# Patient Record
Sex: Female | Born: 1970 | Hispanic: No | Marital: Married | Smoking: Never smoker
Health system: Southern US, Community
[De-identification: ages and names within clinical notes are randomized; demographics above are authoritative.]

## PROBLEM LIST (undated history)

## (undated) DIAGNOSIS — I10 Essential (primary) hypertension: Secondary | ICD-10-CM

## (undated) DIAGNOSIS — F419 Anxiety disorder, unspecified: Secondary | ICD-10-CM

## (undated) HISTORY — PX: BREAST EXCISIONAL BIOPSY: SUR124

## (undated) HISTORY — DX: Essential (primary) hypertension: I10

---

## 2010-05-10 ENCOUNTER — Other Ambulatory Visit: Payer: Self-pay | Admitting: Obstetrics and Gynecology

## 2010-05-10 DIAGNOSIS — N644 Mastodynia: Secondary | ICD-10-CM

## 2010-05-16 ENCOUNTER — Ambulatory Visit
Admission: RE | Admit: 2010-05-16 | Discharge: 2010-05-16 | Disposition: A | Discharge status: Home / Self Care | Payer: BC Managed Care – PPO | Source: Ambulatory Visit | Attending: Obstetrics and Gynecology | Admitting: Obstetrics and Gynecology

## 2010-05-16 DIAGNOSIS — N644 Mastodynia: Secondary | ICD-10-CM

## 2010-06-27 ENCOUNTER — Other Ambulatory Visit (HOSPITAL_COMMUNITY): Payer: BC Managed Care – PPO

## 2010-07-02 ENCOUNTER — Ambulatory Visit (HOSPITAL_COMMUNITY)
Admission: RE | Admit: 2010-07-02 | Payer: No Typology Code available for payment source | Source: Ambulatory Visit | Admitting: Obstetrics and Gynecology

## 2011-06-23 ENCOUNTER — Other Ambulatory Visit (HOSPITAL_COMMUNITY): Payer: Self-pay | Admitting: Obstetrics and Gynecology

## 2011-06-23 DIAGNOSIS — Z1231 Encounter for screening mammogram for malignant neoplasm of breast: Secondary | ICD-10-CM

## 2011-07-17 ENCOUNTER — Ambulatory Visit (HOSPITAL_COMMUNITY)
Admission: RE | Admit: 2011-07-17 | Discharge: 2011-07-17 | Disposition: A | Discharge status: Home / Self Care | Payer: No Typology Code available for payment source | Source: Ambulatory Visit | Attending: Obstetrics and Gynecology | Admitting: Obstetrics and Gynecology

## 2011-07-17 DIAGNOSIS — Z1231 Encounter for screening mammogram for malignant neoplasm of breast: Secondary | ICD-10-CM

## 2011-07-21 ENCOUNTER — Other Ambulatory Visit: Payer: Self-pay | Admitting: Obstetrics and Gynecology

## 2011-07-21 DIAGNOSIS — R928 Other abnormal and inconclusive findings on diagnostic imaging of breast: Secondary | ICD-10-CM

## 2011-08-19 ENCOUNTER — Encounter (HOSPITAL_COMMUNITY): Payer: Self-pay

## 2011-08-19 ENCOUNTER — Ambulatory Visit (INDEPENDENT_AMBULATORY_CARE_PROVIDER_SITE_OTHER): Payer: Self-pay | Admitting: *Deleted

## 2011-08-19 VITALS — BP 117/86 | HR 76 | Temp 98.6°F | Ht 59.0 in | Wt 184.0 lb

## 2011-08-19 DIAGNOSIS — N6311 Unspecified lump in the right breast, upper outer quadrant: Secondary | ICD-10-CM

## 2011-08-19 DIAGNOSIS — N63 Unspecified lump in unspecified breast: Secondary | ICD-10-CM

## 2011-08-19 DIAGNOSIS — Z01419 Encounter for gynecological examination (general) (routine) without abnormal findings: Secondary | ICD-10-CM

## 2011-08-19 NOTE — Progress Notes (Signed)
Patient referred to [facility] from the [facility] for additional imaging of right breast. Screening mammogram completed 07/18/11.  Pap Smear:    Pap smear completed today. Per patient last Pap smear was around April 2011 and normal. Per patient had an abnormal Pap smear in 2009 or 2010 the required a repeat Pap smear. No Pap smear results in EPIC.  Physical exam: Breasts Breasts symmetrical. No skin abnormalities bilateral breasts. No nipple retraction bilateral breasts. No nipple discharge bilateral breasts. No lymphadenopathy. No lumps palpated left breast. Palpated small lump in right breast at 11 o'clock 2 cm from the areola per patient has been there since 2011. No complaints of pain or tenderness on exam. Patient referred to the [facility] for right breast diagnostic mammogram. Appointment scheduled Monday, August 25, 2011 at 0830.        Pelvic/Bimanual   Ext Genitalia No lesions, no swelling and no discharge observed on external genitalia. Patient has a piercing on clitoris.         Vagina Vagina pink and normal texture. No lesions or discharge observed in vagina.          Cervix Cervix is present. Cervix pink and of normal texture. No discharge observed.     Uterus Uterus is present and palpable. Uterus in normal position and normal size.        Adnexae Bilateral ovaries present and palpable. No tenderness on palpation.          Rectovaginal No rectal exam completed today since patient had no rectal complaints. No skin abnormalities observed on exam.

## 2011-08-19 NOTE — Patient Instructions (Signed)
Taught patient how to perform BSE. Let patient know [facility] will cover Pap smears every 3 years unless has a history of abnormal Pap smears and follow up Pap smear will be based on result of today's Pap smear. Patient referred to the [facility] for right breast diagnostic mammogram. Appointment scheduled Monday, August 25, 2011 at 0830. Patient aware of appointment and will be there. Let patient know will follow up with her within the next couple weeks with results. Patient verbalized understanding.

## 2011-08-25 ENCOUNTER — Other Ambulatory Visit: Payer: Self-pay

## 2011-08-29 ENCOUNTER — Other Ambulatory Visit: Payer: Self-pay

## 2011-09-08 ENCOUNTER — Ambulatory Visit
Admission: RE | Admit: 2011-09-08 | Discharge: 2011-09-08 | Disposition: A | Discharge status: Home / Self Care | Payer: No Typology Code available for payment source | Source: Ambulatory Visit | Attending: Obstetrics and Gynecology | Admitting: Obstetrics and Gynecology

## 2011-09-08 DIAGNOSIS — R928 Other abnormal and inconclusive findings on diagnostic imaging of breast: Secondary | ICD-10-CM

## 2011-09-11 ENCOUNTER — Encounter: Payer: Self-pay | Admitting: *Deleted

## 2011-10-20 ENCOUNTER — Telehealth: Payer: Self-pay | Admitting: *Deleted

## 2011-10-20 NOTE — Telephone Encounter (Signed)
Patient called me in regards to having a strong vaginal odor and burning when urinates. Suggested to patient she try the [facility]. Asked patient if has a PCP and suggested that as an option. Told patient can refer her to the wom[facility] clinics but may be a couple of weeks before she can be seen and if has an infection does not want to wait two weeks. Patient stated she is going to try calling her local Health [facility] first. Told patient if has any questions or has any trouble scheduling an appointment to call me back. Patient verbalized understanding.

## 2011-10-23 ENCOUNTER — Telehealth: Payer: Self-pay | Admitting: *Deleted

## 2011-10-23 NOTE — Telephone Encounter (Signed)
Patient called me yesterday and left voicemail in regards to having some breast concerns. Patient is a [facility] patient and states she has some breast concerns. Called patient back and she stated the area of concern in her right breast has increased in size since seen at [facility] 08/19/11 and is painful to the touch. Let patient know she will need to be seen in [facility] clinic before we can order a diagnostic mammogram. Let patient know either Sabrina or I will call her back with appointment. Patient verbalized understanding.

## 2011-10-24 ENCOUNTER — Telehealth: Payer: Self-pay | Admitting: *Deleted

## 2011-10-24 NOTE — Telephone Encounter (Signed)
Telephoned patient at home # and advised of appointment scheduled for Tuesday August 6 1:00pm Patient voiced understanding.

## 2011-10-28 ENCOUNTER — Encounter (HOSPITAL_COMMUNITY): Payer: Self-pay

## 2011-10-28 ENCOUNTER — Ambulatory Visit (HOSPITAL_COMMUNITY)
Admission: RE | Admit: 2011-10-28 | Discharge: 2011-10-28 | Disposition: A | Discharge status: Home / Self Care | Payer: Self-pay | Source: Ambulatory Visit | Attending: Obstetrics and Gynecology | Admitting: Obstetrics and Gynecology

## 2011-10-28 ENCOUNTER — Other Ambulatory Visit: Payer: Self-pay | Admitting: Obstetrics and Gynecology

## 2011-10-28 DIAGNOSIS — N644 Mastodynia: Secondary | ICD-10-CM

## 2011-10-28 DIAGNOSIS — N6310 Unspecified lump in the right breast, unspecified quadrant: Secondary | ICD-10-CM

## 2011-10-28 DIAGNOSIS — Z1239 Encounter for other screening for malignant neoplasm of breast: Secondary | ICD-10-CM

## 2011-11-27 ENCOUNTER — Telehealth: Payer: Self-pay | Admitting: *Deleted

## 2011-11-27 NOTE — Telephone Encounter (Signed)
Telephoned patient at home number. Patient will call and reschedule diagnostic mammogram.

## 2012-01-12 NOTE — Patient Instructions (Signed)
See detailed notes in EPIC scanned under media.

## 2012-01-12 NOTE — Progress Notes (Signed)
See detailed notes in EPIC scanned under media. 

## 2012-03-03 NOTE — Addendum Note (Signed)
Encounter addended by: Saintclair Halsted, RN on: 03/03/2012  2:19 PM<BR>     Documentation filed: Charges VN

## 2012-05-27 ENCOUNTER — Telehealth (HOSPITAL_COMMUNITY): Payer: Self-pay | Admitting: *Deleted

## 2012-05-27 NOTE — Telephone Encounter (Signed)
Telephoned patient at home # and patient states went to see a GYN in [facility] and that GYN Dr. Charline Bills "may be just lumpy breast". Advised patient to reschedule right diagnostic mammogram at breast center. Patient still covered under B[facility]CP. Patient voiced understanding.

## 2012-07-12 ENCOUNTER — Telehealth (HOSPITAL_COMMUNITY): Payer: Self-pay | Admitting: *Deleted

## 2012-07-12 ENCOUNTER — Encounter (HOSPITAL_COMMUNITY): Payer: Self-pay | Admitting: *Deleted

## 2012-07-12 NOTE — Telephone Encounter (Signed)
Called patient to remind her to reschedule her right breast diagnostic mammogram at the [facility] [place]. Gave patient phone number to call the Breast Center. Reminded patient that her [facility] card expires 08/18/2012. Let her know that if she doesn't reschedule prior to 08/18/12 that she will need to come to [facility] again prior to her appointment. Patient verbalized understanding.

## 2012-09-17 ENCOUNTER — Telehealth (HOSPITAL_COMMUNITY): Payer: Self-pay | Admitting: *Deleted

## 2012-09-17 NOTE — Telephone Encounter (Signed)
Telephoned patient at home # and left message to return call to [facility] 

## 2012-12-06 ENCOUNTER — Other Ambulatory Visit: Payer: Self-pay | Admitting: Obstetrics and Gynecology

## 2012-12-06 DIAGNOSIS — Z1231 Encounter for screening mammogram for malignant neoplasm of breast: Secondary | ICD-10-CM

## 2012-12-07 ENCOUNTER — Inpatient Hospital Stay (HOSPITAL_COMMUNITY): Admission: RE | Admit: 2012-12-07 | Payer: No Typology Code available for payment source | Source: Ambulatory Visit

## 2012-12-07 ENCOUNTER — Ambulatory Visit (HOSPITAL_COMMUNITY): Payer: No Typology Code available for payment source | Attending: Obstetrics and Gynecology

## 2013-09-29 ENCOUNTER — Emergency Department (INDEPENDENT_AMBULATORY_CARE_PROVIDER_SITE_OTHER)
Admission: EM | Admit: 2013-09-29 | Discharge: 2013-09-29 | Disposition: A | Discharge status: Home / Self Care | Payer: Self-pay | Source: Home / Self Care | Attending: Emergency Medicine | Admitting: Emergency Medicine

## 2013-09-29 ENCOUNTER — Encounter (HOSPITAL_COMMUNITY): Payer: Self-pay | Admitting: Emergency Medicine

## 2013-09-29 DIAGNOSIS — T192XXA Foreign body in vulva and vagina, initial encounter: Secondary | ICD-10-CM

## 2013-09-29 DIAGNOSIS — X58XXXA Exposure to other specified factors, initial encounter: Secondary | ICD-10-CM

## 2013-09-29 NOTE — Discharge Instructions (Signed)
Use Vagisil cream as soothing ointment.

## 2013-09-29 NOTE — ED Provider Notes (Signed)
  Chief Complaint   Chief Complaint  Patient presents with  . Foreign Body in Vagina    History of Present Illness   Taylor Casey is a 43 year old female who's husband inserted a lemon in her vagina a couple of hours ago. She's been unable to remove it. She denies any pain or discharge. There's been no bleeding.  Review of Systems   Other than as noted above, the patient denies any of the following symptoms: Systemic:  No fever or chills GI:  No abdominal pain, nausea, vomiting, diarrhea, constipation, melena or hematochezia. GU:  No dysuria, frequency, urgency, hematuria, vaginal discharge, itching, or abnormal vaginal bleeding.  [place]   Past medical history, family history, social history, meds, and allergies were reviewed.    Physical Examination    Vital signs:  BP 137/90  Pulse 90  Temp(Src) 98.9 F (37.2 C) (Oral)  Resp 16  SpO2 100% General:  Alert, oriented and in no distress. Lungs:  Breath sounds clear and equal bilaterally.  No wheezes, rales or rhonchi. Heart:  Regular rhythm.  No gallops or murmers. Abdomen:  Soft, flat and non-distended.  No organomegaly or mass.  No tenderness, guarding or rebound.  Bowel sounds normally active. Pelvic exam:  External genitalia unremarkable. There is a lemon in the vagina. This was removed with ring forceps. There was a little bit of irritation of the vaginal wall but no discharge or bleeding. Cervix appeared normal. Uterus was midposition, normal in size and shape, and there were no adnexal masses or tenderness. Skin:  Clear, warm and dry.  Chaperoned by Ninfa Linden, RN who was present throughout the pelvic exam.   Procedure Note:  Verbal informed consent was obtained from the patient.  Risks and benefits were outlined with the patient.  Patient understands and accepts these risks. A time out was called and the procedure and identity of the patient were confirmed verbally.    The procedure was then performed as follows:   The lemon was visualized with a speculum. It was then grasped with ring forceps and easily removed. Patient tolerated this procedure well.  The patient tolerated the procedure well without any immediate complications.   Assessment   The encounter diagnosis was Foreign body in vagina, initial encounter.       Plan    1.  Meds:  The following meds were prescribed:  There are no discharge medications for this patient.   2.  Patient Education/Counseling:  The patient was given appropriate handouts, self care instructions, and instructed in symptomatic relief.  Suggested that she use Vagisil as a soothing ointment.  3.  Follow up:  The patient was told to follow up here if no better in 3 to 4 days, or sooner if becoming worse in any way, and given some red flag symptoms such as worsening pain, fever, persistent vomiting, or heavy vaginal bleeding which would prompt immediate return.       Harden Mo, MD 09/29/13 [phone]

## 2013-09-29 NOTE — ED Notes (Signed)
Here for removal of FB of vagina , present for aprox 1 hour PTA, NAD, denies any concern for STD today

## 2014-01-23 ENCOUNTER — Encounter (HOSPITAL_COMMUNITY): Payer: Self-pay | Admitting: Emergency Medicine

## 2014-04-27 ENCOUNTER — Encounter (HOSPITAL_COMMUNITY): Payer: Self-pay | Admitting: Emergency Medicine

## 2014-04-27 ENCOUNTER — Emergency Department (INDEPENDENT_AMBULATORY_CARE_PROVIDER_SITE_OTHER)
Admission: EM | Admit: 2014-04-27 | Discharge: 2014-04-27 | Disposition: A | Discharge status: Home / Self Care | Payer: Self-pay | Source: Home / Self Care | Attending: Family Medicine | Admitting: Family Medicine

## 2014-04-27 DIAGNOSIS — L259 Unspecified contact dermatitis, unspecified cause: Secondary | ICD-10-CM

## 2014-04-27 MED ORDER — TRIAMCINOLONE ACETONIDE 0.1 % EX CREA: 1.0000 "application " | TOPICAL_CREAM | Freq: Two times a day (BID) | CUTANEOUS | Status: DC

## 2014-04-27 NOTE — ED Provider Notes (Signed)
CSN: [id number]     Arrival date & time 04/27/14  1743 History   None    Chief Complaint  Patient presents with  . Rash   (Consider location/radiation/quality/duration/timing/severity/associated sxs/prior Treatment) HPI Comments: 44 year old female presents with a itchy rash around her neck for approximately one and half months. The rash is associated with a darkening discoloration and small papules. She believes it may be calls from a chemical that has been applied to her hair. It does not bleed, drain or become moist.  Patient is a 44 y.o. female presenting with rash.  Rash   Past Medical History  Diagnosis Date  . Diabetes mellitus   . Hypertension    Past Surgical History  Procedure Laterality Date  . Cesarean section     Family History  Problem Relation Age of Onset  . Diabetes Paternal Grandfather   . Hypertension Mother   . Cancer Sister     breast cancer   History  Substance Use Topics  . Smoking status: Never Smoker   . Smokeless tobacco: Never Used  . Alcohol Use: 1.0 oz/week    2 drink(s) per week   OB History    Gravida Para Term Preterm AB TAB SAB Ectopic Multiple Living   6 3 3  3  3   3      Review of Systems  Constitutional: Negative.   Skin: Positive for rash.  All other systems reviewed and are negative.   Allergies  Review of patient's allergies indicates no known allergies.  Home Medications   Prior to Admission medications   Medication Sig Start Date End Date Taking? Authorizing Provider  diphenhydrAMINE-zinc acetate (BENADRYL) cream Apply 1 application topically 3 (three) times daily as needed for itching.   Yes Historical Provider, MD  hydrocortisone cream 0.5 % Apply 1 application topically 2 (two) times daily.   Yes Historical Provider, MD  triamcinolone cream (KENALOG) 0.1 % Apply 1 application topically 2 (two) times daily. 04/27/14   Janne Napoleon, NP   BP 144/101 mmHg  Pulse 80  Temp(Src) 98.6 F (37 C) (Oral)  Resp 18  SpO2  99% Physical Exam  Constitutional: She is oriented to person, place, and time. She appears well-developed and well-nourished. No distress.  Neck: Neck supple.  Cardiovascular: Normal rate.   Pulmonary/Chest: Effort normal. No respiratory distress.  Lymphadenopathy:    She has no cervical adenopathy.  Neurological: She is alert and oriented to person, place, and time.  Skin: Skin is warm. Rash noted.  Darker pigmentation that is fairly well marginated surrounding the neck. It follows the creases within the skin folds. She has a fatty neck. Under magnification there are small papular vesicular lesions. Areas are dry.  Psychiatric: She has a normal mood and affect.  Nursing note and vitals reviewed.   ED Course  Procedures (including critical care time) Labs Review Labs Reviewed - No data to display  Imaging Review No results found.   MDM   1. Contact dermatitis    Keep neck area dry Avoid causative agent triamcinlone cream bid F/U with your PCP   Janne Napoleon, NP 04/27/14 1814

## 2014-04-27 NOTE — ED Notes (Signed)
Reports 1 month to 1 1/2 months history of irritation, redness, itchy skin to right side of neck.  Reports rash is traveling around neck to the left side of neck.  Patient had a similar skin irritation when she died her hair a year ago.

## 2014-04-27 NOTE — Discharge Instructions (Signed)

## 2014-05-14 ENCOUNTER — Emergency Department (HOSPITAL_COMMUNITY): Payer: Medicaid Other

## 2014-05-14 ENCOUNTER — Encounter (HOSPITAL_COMMUNITY): Payer: Self-pay | Admitting: Emergency Medicine

## 2014-05-14 ENCOUNTER — Emergency Department (HOSPITAL_COMMUNITY)
Admission: EM | Admit: 2014-05-14 | Discharge: 2014-05-14 | Disposition: A | Discharge status: Home / Self Care | Payer: Medicaid Other | Attending: Emergency Medicine | Admitting: Emergency Medicine

## 2014-05-14 DIAGNOSIS — D259 Leiomyoma of uterus, unspecified: Secondary | ICD-10-CM | POA: Insufficient documentation

## 2014-05-14 DIAGNOSIS — Z202 Contact with and (suspected) exposure to infections with a predominantly sexual mode of transmission: Secondary | ICD-10-CM | POA: Diagnosis present

## 2014-05-14 DIAGNOSIS — R1032 Left lower quadrant pain: Secondary | ICD-10-CM

## 2014-05-14 DIAGNOSIS — E119 Type 2 diabetes mellitus without complications: Secondary | ICD-10-CM | POA: Insufficient documentation

## 2014-05-14 DIAGNOSIS — I10 Essential (primary) hypertension: Secondary | ICD-10-CM | POA: Insufficient documentation

## 2014-05-14 LAB — URINALYSIS, ROUTINE W REFLEX MICROSCOPIC
BILIRUBIN URINE: NEGATIVE
Glucose, UA: NEGATIVE mg/dL
HGB URINE DIPSTICK: NEGATIVE
KETONES UR: NEGATIVE mg/dL
LEUKOCYTES UA: NEGATIVE
NITRITE: NEGATIVE
PROTEIN: NEGATIVE mg/dL
SPECIFIC GRAVITY, URINE: 1.029 (ref 1.005–1.030)
Urobilinogen, UA: 0.2 mg/dL (ref 0.0–1.0)
pH: 5.5 (ref 5.0–8.0)

## 2014-05-14 LAB — WET PREP, GENITAL
CLUE CELLS WET PREP: NONE SEEN
Trich, Wet Prep: NONE SEEN
WBC, Wet Prep HPF POC: NONE SEEN
YEAST WET PREP: NONE SEEN

## 2014-05-14 MED ORDER — HYDROCODONE-ACETAMINOPHEN 5-325 MG PO TABS: 1.0000 | ORAL_TABLET | Freq: Four times a day (QID) | ORAL | Status: DC | PRN

## 2014-05-14 MED ORDER — AZITHROMYCIN 250 MG PO TABS
1000.0000 mg | ORAL_TABLET | Freq: Every day | ORAL | Status: DC
  Administered 2014-05-14: 1000 mg via ORAL
  Filled 2014-05-14: qty 4

## 2014-05-14 MED ORDER — LIDOCAINE HCL (PF) 1 % IJ SOLN
INTRAMUSCULAR | Status: AC
  Administered 2014-05-14: 5 mL
  Filled 2014-05-14: qty 5

## 2014-05-14 MED ORDER — CEFTRIAXONE SODIUM 250 MG IJ SOLR
250.0000 mg | Freq: Once | INTRAMUSCULAR | Status: AC
  Administered 2014-05-14: 250 mg via INTRAMUSCULAR
  Filled 2014-05-14: qty 250

## 2014-05-14 NOTE — ED Notes (Signed)
Pt has [place] at bedside.

## 2014-05-14 NOTE — Discharge Instructions (Signed)
Fibroids Fibroids are lumps (tumors) that can occur any place in a woman's body. These lumps are not cancerous. Fibroids vary in size, weight, and where they grow. HOME CARE  Do not take aspirin.  Write down the number of pads or tampons you use during your period. Tell your doctor. This can help determine the best treatment for you. GET HELP RIGHT AWAY IF:  You have pain in your lower belly (abdomen) that is not helped with medicine.  You have cramps that are not helped with medicine.  You have more bleeding between or during your period.  You feel lightheaded or pass out (faint).  Your lower belly pain gets worse. MAKE SURE YOU:  Understand these instructions.  Will watch your condition.  Will get help right away if you are not doing well or get worse. Document Released: 04/12/2010 Document Revised: 06/02/2011 Document Reviewed: 04/12/2010 ExitCare Patient Information 2015 [employer]. This information is not intended to replace advice given to you by your health care provider. Make sure you discuss any questions you have with your health care provider.  

## 2014-05-14 NOTE — ED Notes (Signed)
Pt c/o L side/back pain x 2 weeks. Denies urinary symptoms.  Pt also c/o L ovarian pain. Pt has hx of same pain and was diagnosed with a small cyst on her ovary. Pain had subsided since original diagnosis but now pain is back and worse than before. Pt sts she knows she is ovulating. Pt also had sexual intercourse 5 days ago and the condom broke and she is concerned about STDs. Pt A&Ox4 and ambulatory.

## 2014-05-14 NOTE — ED Notes (Signed)
Pt reports pain in Lt mid side that is tender to touch.  Pt reports has been going on for last several weeks.  Pt also pain vaginal area.  Sts hx of cyst.  She reports a condom broke a couple of days ago and wants to be tested for STDs.

## 2014-05-14 NOTE — ED Provider Notes (Signed)
CSN: [id number]     Arrival date & time 05/14/14  1626 History   First MD Initiated Contact with Patient 05/14/14 1630     Chief Complaint  Patient presents with  . Abdominal Pain  . Exposure to STD     (Consider location/radiation/quality/duration/timing/severity/associated sxs/prior Treatment) HPI Comments: Here with 2 weeks of left flank pain. Described as a pulling. States feels like prior ovarian cyst and ovulation cramping, but worse. Normally this pain is a 3 out of 10, today a little different and rated as a 7 out of 10. Left leg pain for 2 weeks, left ovarian cyst site pain for about one week. She is concern for STDs because the condom broke last week.  Patient is a 44 y.o. female presenting with abdominal pain and STD exposure. The history is provided by the patient.  Abdominal Pain Pain location:  L flank and LLQ Pain quality: aching   Pain radiates to:  Does not radiate Pain severity:  Mild Duration:  2 weeks Timing:  Constant Progression:  Worsening Chronicity:  New Relieved by:  Nothing Worsened by:  Nothing tried Associated symptoms: no cough, no fever, no nausea, no shortness of breath and no vomiting   Exposure to STD Associated symptoms include abdominal pain. Pertinent negatives include no shortness of breath.    Past Medical History  Diagnosis Date  . Diabetes mellitus   . Hypertension    Past Surgical History  Procedure Laterality Date  . Cesarean section     Family History  Problem Relation Age of Onset  . Diabetes Paternal Grandfather   . Hypertension Mother   . Cancer Sister     breast cancer   History  Substance Use Topics  . Smoking status: Never Smoker   . Smokeless tobacco: Never Used  . Alcohol Use: 1.0 oz/week    2 drink(s) per week   OB History    Gravida Para Term Preterm AB TAB SAB Ectopic Multiple Living   6 3 3  3  3   3      Review of Systems  Constitutional: Negative for fever.  Respiratory: Negative for cough and  shortness of breath.   Gastrointestinal: Positive for abdominal pain. Negative for nausea and vomiting.  All other systems reviewed and are negative.     Allergies  Review of patient's allergies indicates no known allergies.  Home Medications   Prior to Admission medications   Medication Sig Start Date End Date Taking? Authorizing Provider  Boric Acid POWD Apply 1 capsule topically every Friday. Insert's in vagina weekly for uterus   Yes Historical Provider, MD  triamcinolone cream (KENALOG) 0.1 % Apply 1 application topically 2 (two) times daily. Patient not taking: Reported on 05/14/2014 04/27/14   Janne Napoleon, NP   BP 127/74 mmHg  Pulse 92  Temp(Src) 98.4 F (36.9 C) (Oral)  Resp 16  SpO2 100%  LMP 04/29/2014 Physical Exam  Constitutional: She is oriented to person, place, and time. She appears well-developed and well-nourished. No distress.  HENT:  Head: Normocephalic and atraumatic.  Mouth/Throat: Oropharynx is clear and moist.  Eyes: EOM are normal. Pupils are equal, round, and reactive to light.  Neck: Normal range of motion. Neck supple.  Cardiovascular: Normal rate and regular rhythm.  Exam reveals no friction rub.   No murmur heard. Pulmonary/Chest: Effort normal and breath sounds normal. No respiratory distress. She has no wheezes. She has no rales.  Abdominal: Soft. She exhibits no distension. There is no tenderness. There  is no rebound.  Genitourinary: Cervix exhibits motion tenderness (very minimal) and discharge (mild, white). Right adnexum displays no mass, no tenderness and no fullness. Left adnexum displays tenderness (mild). Left adnexum displays no mass and no fullness.  Musculoskeletal: Normal range of motion. She exhibits no edema.  Neurological: She is alert and oriented to person, place, and time.  Skin: No rash noted. She is not diaphoretic.  Nursing note and vitals reviewed.   ED Course  Procedures (including critical care time) Labs Review Labs  Reviewed  WET PREP, GENITAL  URINALYSIS, ROUTINE W REFLEX MICROSCOPIC  GC/CHLAMYDIA PROBE AMP ([place])    Imaging Review US Transvaginal Non-ob  05/14/2014   CLINICAL DATA:  Left lower quadrant pain and left lower back sat 8 pain for 2 weeks. History of fibroid uterus and diabetes.  EXAM: TRANSABDOMINAL AND TRANSVAGINAL ULTRASOUND OF PELVIS  TECHNIQUE: Both transabdominal and transvaginal ultrasound examinations of the pelvis were performed. Transabdominal technique was performed for global imaging of the pelvis including uterus, ovaries, adnexal regions, and pelvic cul-de-sac. It was necessary to proceed with endovaginal exam following the transabdominal exam to visualize the uterus and ovaries.  COMPARISON:  None  FINDINGS: Uterus  Measurements: 15.5 x 10.5 x 11.2 cm. Diffusely enlarged. Diffuse nodular heterogeneous myometrial appearance consistent with multiple fibroids. Focal lesions are measured posteriorly at 3.5 x 2.2 x 4.1 cm, anteriorly at 3.9 x 6.1 x 5.4 cm, and laterally to the left at 3.2 x 2.8 x 2.6 cm. Small nabothian cysts in the cervix.  Endometrium  Thickness: 3.8 mm. Limited visibility due to distortion by fibroids. No focal abnormality suggested is visualized.  Right ovary  Measurements: 4.2 x 2 x 3.3 cm. Normal appearance/no adnexal mass.  Left ovary  Measurements: 2.1 x 1.8 x 3.1 cm. Normal appearance/no adnexal mass.  Other findings  No free fluid.  Note that ovaries were not visualized transvaginally due to enlarged uterus and were only seen transabdominally.  IMPRESSION: Diffuse nodular enlargement of the uterus consistent with multiple fibroids. Limited visualization of the endometrium and ovaries appears normal.   Electronically Signed   By: Lucienne Capers M.D.   On: 05/14/2014 20:04   US Pelvis Complete  05/14/2014   CLINICAL DATA:  Left lower quadrant pain and left lower back sat 8 pain for 2 weeks. History of fibroid uterus and diabetes.  EXAM: TRANSABDOMINAL AND  TRANSVAGINAL ULTRASOUND OF PELVIS  TECHNIQUE: Both transabdominal and transvaginal ultrasound examinations of the pelvis were performed. Transabdominal technique was performed for global imaging of the pelvis including uterus, ovaries, adnexal regions, and pelvic cul-de-sac. It was necessary to proceed with endovaginal exam following the transabdominal exam to visualize the uterus and ovaries.  COMPARISON:  None  FINDINGS: Uterus  Measurements: 15.5 x 10.5 x 11.2 cm. Diffusely enlarged. Diffuse nodular heterogeneous myometrial appearance consistent with multiple fibroids. Focal lesions are measured posteriorly at 3.5 x 2.2 x 4.1 cm, anteriorly at 3.9 x 6.1 x 5.4 cm, and laterally to the left at 3.2 x 2.8 x 2.6 cm. Small nabothian cysts in the cervix.  Endometrium  Thickness: 3.8 mm. Limited visibility due to distortion by fibroids. No focal abnormality suggested is visualized.  Right ovary  Measurements: 4.2 x 2 x 3.3 cm. Normal appearance/no adnexal mass.  Left ovary  Measurements: 2.1 x 1.8 x 3.1 cm. Normal appearance/no adnexal mass.  Other findings  No free fluid.  Note that ovaries were not visualized transvaginally due to enlarged uterus and were only seen transabdominally.  IMPRESSION: Diffuse nodular enlargement of the uterus consistent with multiple fibroids. Limited visualization of the endometrium and ovaries appears normal.   Electronically Signed   By: Lucienne Capers M.D.   On: 05/14/2014 20:04   US Renal  05/14/2014   CLINICAL DATA:  44 year old female with a history of abdominal pain.  EXAM: RENAL/URINARY TRACT ULTRASOUND COMPLETE  COMPARISON:  None.  FINDINGS: Right Kidney:  Length: 11.2 cm. Echogenicity within normal limits. No mass or hydronephrosis visualized.  Left Kidney:  Length: 10.4 cm. Echogenicity within normal limits. No mass or hydronephrosis visualized.  Bladder:  Appears normal for degree of bladder distention.  IMPRESSION: Unremarkable sonographic survey of the bilateral kidneys.   Signed,  Dulcy Fanny. Earleen Newport, DO  [facility]  [facility]   Electronically Signed   By: Corrie Mckusick D.O.   On: 05/14/2014 19:41     EKG Interpretation None      MDM   Final diagnoses:  Uterine leiomyoma, unspecified location    44 year old female presents with left side pain. Left flank pain present for 2 weeks described as aching. Left lower quadrant pain also present for about a week. She states this is different than her normal ovulation pain. It is much worse at a slightly different. No vaginal bleeding or vaginal discharge, however she is concerned about STDs because she had a condom break with a new partner. No fever, vomiting, diarrhea. Patient here with very mild left lower quadrant tenderness. Very mild left CVA tenderness. Will check urine, do pelvic, do renal and pelvic ultrasounds. US shows fibroids. Renal US ok. Given pain meds. Treated here for STDs. Stable for discharge, instructed to f/u with GYN.  Evelina Bucy, MD 05/14/14 [phone]

## 2014-05-15 LAB — GC/CHLAMYDIA PROBE AMP (~~LOC~~) NOT AT ARMC
CHLAMYDIA, DNA PROBE: NEGATIVE
NEISSERIA GONORRHEA: NEGATIVE

## 2014-05-30 ENCOUNTER — Other Ambulatory Visit: Payer: Self-pay | Admitting: Obstetrics & Gynecology

## 2014-05-30 DIAGNOSIS — N63 Unspecified lump in unspecified breast: Secondary | ICD-10-CM

## 2014-06-05 ENCOUNTER — Other Ambulatory Visit: Payer: No Typology Code available for payment source

## 2014-06-07 ENCOUNTER — Ambulatory Visit
Admission: RE | Admit: 2014-06-07 | Discharge: 2014-06-07 | Disposition: A | Discharge status: Home / Self Care | Payer: Medicaid Other | Source: Ambulatory Visit | Attending: Obstetrics & Gynecology | Admitting: Obstetrics & Gynecology

## 2014-06-07 DIAGNOSIS — N63 Unspecified lump in unspecified breast: Secondary | ICD-10-CM

## 2014-06-08 ENCOUNTER — Other Ambulatory Visit: Payer: Self-pay | Admitting: Obstetrics & Gynecology

## 2014-06-08 DIAGNOSIS — N63 Unspecified lump in unspecified breast: Secondary | ICD-10-CM

## 2014-06-08 DIAGNOSIS — Z803 Family history of malignant neoplasm of breast: Secondary | ICD-10-CM

## 2014-06-15 ENCOUNTER — Ambulatory Visit
Admission: RE | Admit: 2014-06-15 | Discharge: 2014-06-15 | Disposition: A | Discharge status: Home / Self Care | Payer: Medicaid Other | Source: Ambulatory Visit | Attending: Obstetrics & Gynecology | Admitting: Obstetrics & Gynecology

## 2014-06-15 DIAGNOSIS — N63 Unspecified lump in unspecified breast: Secondary | ICD-10-CM

## 2014-06-15 DIAGNOSIS — Z803 Family history of malignant neoplasm of breast: Secondary | ICD-10-CM

## 2014-06-15 MED ORDER — GADOBENATE DIMEGLUMINE 529 MG/ML IV SOLN
19.0000 mL | Freq: Once | INTRAVENOUS | Status: AC | PRN
  Administered 2014-06-15: 19 mL via INTRAVENOUS

## 2014-06-16 ENCOUNTER — Other Ambulatory Visit: Payer: Self-pay | Admitting: Obstetrics & Gynecology

## 2014-06-16 DIAGNOSIS — N6320 Unspecified lump in the left breast, unspecified quadrant: Secondary | ICD-10-CM

## 2014-07-07 ENCOUNTER — Ambulatory Visit
Admission: RE | Admit: 2014-07-07 | Discharge: 2014-07-07 | Disposition: A | Discharge status: Home / Self Care | Payer: Medicaid Other | Source: Ambulatory Visit | Attending: Obstetrics & Gynecology | Admitting: Obstetrics & Gynecology

## 2014-07-07 ENCOUNTER — Other Ambulatory Visit: Payer: Self-pay | Admitting: Obstetrics & Gynecology

## 2014-07-07 DIAGNOSIS — N6320 Unspecified lump in the left breast, unspecified quadrant: Secondary | ICD-10-CM

## 2014-11-23 ENCOUNTER — Other Ambulatory Visit: Payer: Self-pay

## 2014-12-28 ENCOUNTER — Encounter (HOSPITAL_COMMUNITY): Payer: Self-pay

## 2014-12-28 ENCOUNTER — Emergency Department (HOSPITAL_COMMUNITY): Payer: Medicaid Other

## 2014-12-28 ENCOUNTER — Emergency Department (HOSPITAL_COMMUNITY)
Admission: EM | Admit: 2014-12-28 | Discharge: 2014-12-28 | Disposition: A | Discharge status: Home / Self Care | Payer: Medicaid Other | Attending: Emergency Medicine | Admitting: Emergency Medicine

## 2014-12-28 DIAGNOSIS — I10 Essential (primary) hypertension: Secondary | ICD-10-CM | POA: Insufficient documentation

## 2014-12-28 DIAGNOSIS — E119 Type 2 diabetes mellitus without complications: Secondary | ICD-10-CM | POA: Insufficient documentation

## 2014-12-28 DIAGNOSIS — Z79899 Other long term (current) drug therapy: Secondary | ICD-10-CM | POA: Insufficient documentation

## 2014-12-28 DIAGNOSIS — M545 Low back pain: Secondary | ICD-10-CM | POA: Insufficient documentation

## 2014-12-28 DIAGNOSIS — B349 Viral infection, unspecified: Secondary | ICD-10-CM | POA: Insufficient documentation

## 2014-12-28 DIAGNOSIS — R079 Chest pain, unspecified: Secondary | ICD-10-CM | POA: Diagnosis present

## 2014-12-28 LAB — CBC WITH DIFFERENTIAL/PLATELET
Basophils Absolute: 0 10*3/uL (ref 0.0–0.1)
Basophils Relative: 0 %
EOS PCT: 0 %
Eosinophils Absolute: 0 10*3/uL (ref 0.0–0.7)
HEMATOCRIT: 31.4 % — AB (ref 36.0–46.0)
HEMOGLOBIN: 9.7 g/dL — AB (ref 12.0–15.0)
LYMPHS ABS: 0.8 10*3/uL (ref 0.7–4.0)
LYMPHS PCT: 6 %
MCH: 23.4 pg — AB (ref 26.0–34.0)
MCHC: 30.9 g/dL (ref 30.0–36.0)
MCV: 75.7 fL — AB (ref 78.0–100.0)
Monocytes Absolute: 0.7 10*3/uL (ref 0.1–1.0)
Monocytes Relative: 6 %
NEUTROS ABS: 10.9 10*3/uL — AB (ref 1.7–7.7)
NEUTROS PCT: 88 %
Platelets: 308 10*3/uL (ref 150–400)
RBC: 4.15 MIL/uL (ref 3.87–5.11)
RDW: 15.3 % (ref 11.5–15.5)
WBC: 12.4 10*3/uL — AB (ref 4.0–10.5)

## 2014-12-28 LAB — COMPREHENSIVE METABOLIC PANEL
ALK PHOS: 73 U/L (ref 38–126)
ALT: 18 U/L (ref 14–54)
AST: 26 U/L (ref 15–41)
Albumin: 4.4 g/dL (ref 3.5–5.0)
Anion gap: 7 (ref 5–15)
BILIRUBIN TOTAL: 0.5 mg/dL (ref 0.3–1.2)
BUN: 13 mg/dL (ref 6–20)
CO2: 22 mmol/L (ref 22–32)
CREATININE: 0.87 mg/dL (ref 0.44–1.00)
Calcium: 9.6 mg/dL (ref 8.9–10.3)
Chloride: 107 mmol/L (ref 101–111)
Glucose, Bld: 116 mg/dL — ABNORMAL HIGH (ref 65–99)
Potassium: 3.9 mmol/L (ref 3.5–5.1)
Sodium: 136 mmol/L (ref 135–145)
Total Protein: 8.1 g/dL (ref 6.5–8.1)

## 2014-12-28 LAB — URINE MICROSCOPIC-ADD ON

## 2014-12-28 LAB — URINALYSIS, ROUTINE W REFLEX MICROSCOPIC
BILIRUBIN URINE: NEGATIVE
GLUCOSE, UA: NEGATIVE mg/dL
Ketones, ur: NEGATIVE mg/dL
Nitrite: NEGATIVE
PROTEIN: NEGATIVE mg/dL
Specific Gravity, Urine: 1.021 (ref 1.005–1.030)
UROBILINOGEN UA: 0.2 mg/dL (ref 0.0–1.0)
pH: 6.5 (ref 5.0–8.0)

## 2014-12-28 MED ORDER — IBUPROFEN 800 MG PO TABS
800.0000 mg | ORAL_TABLET | Freq: Once | ORAL | Status: AC
  Administered 2014-12-28: 800 mg via ORAL
  Filled 2014-12-28: qty 1

## 2014-12-28 MED ORDER — ONDANSETRON 4 MG PO TBDP
4.0000 mg | ORAL_TABLET | Freq: Once | ORAL | Status: AC
  Administered 2014-12-28: 4 mg via ORAL
  Filled 2014-12-28: qty 1

## 2014-12-28 MED ORDER — IBUPROFEN 800 MG PO TABS: ORAL_TABLET | ORAL | Status: AC

## 2014-12-28 NOTE — ED Provider Notes (Signed)
CSN: [id number]     Arrival date & time 12/28/14  1927 History   First MD Initiated Contact with Patient 12/28/14 2017     Chief Complaint  Patient presents with  . Chest Pain  . Back Pain     (Consider location/radiation/quality/duration/timing/severity/associated sxs/prior Treatment) Patient is a 44 y.o. female presenting with chest pain and back pain. The history is provided by the patient (Patient complains of fevers and aches and some chest pain today. Patient states that one of her coworkers has been sick with a virus recently).  Chest Pain Pain location:  L chest Pain quality: aching   Pain radiates to:  Does not radiate Pain radiates to the back: no   Pain severity:  Moderate Onset quality:  Sudden Timing:  Intermittent Chronicity:  New Associated symptoms: back pain   Associated symptoms: no abdominal pain, no cough, no fatigue and no headache   Back Pain Associated symptoms: chest pain   Associated symptoms: no abdominal pain and no headaches     Past Medical History  Diagnosis Date  . Diabetes mellitus   . Hypertension    Past Surgical History  Procedure Laterality Date  . Cesarean section     Family History  Problem Relation Age of Onset  . Diabetes Paternal Grandfather   . Hypertension Mother   . Cancer Sister     breast cancer   Social History  Substance Use Topics  . Smoking status: Never Smoker   . Smokeless tobacco: Never Used  . Alcohol Use: 1.0 oz/week    2 drink(s) per week   OB History    Gravida Para Term Preterm AB TAB SAB Ectopic Multiple Living   6 3 3  3  3   3      Review of Systems  Constitutional: Negative for appetite change and fatigue.  HENT: Negative for congestion, ear discharge and sinus pressure.   Eyes: Negative for discharge.  Respiratory: Negative for cough.   Cardiovascular: Positive for chest pain.  Gastrointestinal: Negative for abdominal pain and diarrhea.  Genitourinary: Negative for frequency and hematuria.   Musculoskeletal: Positive for back pain.  Skin: Negative for rash.  Neurological: Negative for seizures and headaches.  Psychiatric/Behavioral: Negative for hallucinations.      Allergies  Review of patient's allergies indicates no known allergies.  Home Medications   Prior to Admission medications   Medication Sig Start Date End Date Taking? Authorizing Provider  Boric Acid POWD Place 1 suppository vaginally daily.   Yes Historical Provider, MD  Flaxseed, Linseed, (FLAX SEED OIL) 1000 MG CAPS Take 1,000 mg by mouth daily.   Yes Historical Provider, MD  Garlic 9233 MG CAPS Take by mouth.   Yes Historical Provider, MD  ibuprofen (ADVIL,MOTRIN) 800 MG tablet Take one every 8 hours for aches and fever 12/28/14   Milton Ferguson, MD   BP 139/98 mmHg  Pulse 124  Temp(Src) 100.3 F (37.9 C) (Oral)  Resp 18  SpO2 100%  LMP 12/28/2014 Physical Exam  Constitutional: She is oriented to person, place, and time. She appears well-developed.  HENT:  Head: Normocephalic.  Eyes: Conjunctivae and EOM are normal. No scleral icterus.  Neck: Neck supple. No thyromegaly present.  Cardiovascular: Normal rate and regular rhythm.  Exam reveals no gallop and no friction rub.   No murmur heard. Pulmonary/Chest: No stridor. She has no wheezes. She has no rales. She exhibits no tenderness.  Abdominal: She exhibits no distension. There is no tenderness. There is no rebound.  Musculoskeletal: Normal range of motion. She exhibits no edema.  Lymphadenopathy:    She has no cervical adenopathy.  Neurological: She is oriented to person, place, and time. She exhibits normal muscle tone. Coordination normal.  Skin: No rash noted. No erythema.  Psychiatric: She has a normal mood and affect. Her behavior is normal.    ED Course  Procedures (including critical care time) Labs Review Labs Reviewed  CBC WITH DIFFERENTIAL/PLATELET - Abnormal; Notable for the following:    WBC 12.4 (*)    Hemoglobin 9.7 (*)     HCT 31.4 (*)    MCV 75.7 (*)    MCH 23.4 (*)    Neutro Abs 10.9 (*)    All other components within normal limits  COMPREHENSIVE METABOLIC PANEL - Abnormal; Notable for the following:    Glucose, Bld 116 (*)    All other components within normal limits  URINALYSIS, ROUTINE W REFLEX MICROSCOPIC (NOT AT [facility]) - Abnormal; Notable for the following:    APPearance CLOUDY (*)    Hgb urine dipstick LARGE (*)    Leukocytes, UA SMALL (*)    All other components within normal limits  URINE MICROSCOPIC-ADD ON - Abnormal; Notable for the following:    Squamous Epithelial / LPF FEW (*)    Bacteria, UA FEW (*)    All other components within normal limits    Imaging Review Dg Chest 2 View  12/28/2014   CLINICAL DATA:  Pt complains of a pressure like chest pain that started yesterday ans now is radiating to her back and down her legs and to her head. Pt states she also has a fever. Hx of HTN. Shielded pt.  EXAM: CHEST - 2 VIEW  COMPARISON:  12/04/2009  FINDINGS: Lungs are clear. Heart size and mediastinal contours are within normal limits. No effusion.  No pneumothorax. Visualized skeletal structures are unremarkable.  IMPRESSION: No acute cardiopulmonary disease.   Electronically Signed   By: Lucrezia Europe M.D.   On: 12/28/2014 20:42   I have personally reviewed and evaluated these images and lab results as part of my medical decision-making.   EKG Interpretation   Date/Time:  Thursday December 28 2014 19:40:26 EDT Ventricular Rate:  124 PR Interval:  163 QRS Duration: 79 QT Interval:  317 QTC Calculation: 455 R Axis:   105 Text Interpretation:  Sinus tachycardia Right axis deviation Confirmed by  Seham Gardenhire  MD, Jud Fanguy [phone]) on 12/28/2014 10:44:14 PM      MDM   Final diagnoses:  Viral syndrome      Labs and chest x-ray unremarkable, patient improving with Motrin. Suspect viral syndrome. Patient given prescription of Motrin and will follow up with PCP  Milton Ferguson, MD 12/28/14 [phone]

## 2014-12-28 NOTE — ED Notes (Signed)
Patient notified urine sample needed.  

## 2014-12-28 NOTE — Progress Notes (Signed)
Patient listed as having Medicaid [place] [employer] insurance with [employer] listed as her pcp.  Patient reports she is seen at the [facility] on [address] in [place].  System updated.

## 2014-12-28 NOTE — ED Notes (Signed)
Pt complains of a pressure like chest pain that started yesterday ans now is radiating to her back and down her legs and to her head

## 2014-12-28 NOTE — Discharge Instructions (Signed)
Follow up next week if not improving. °

## 2015-01-29 ENCOUNTER — Other Ambulatory Visit: Payer: Self-pay | Admitting: Obstetrics & Gynecology

## 2015-01-29 DIAGNOSIS — IMO0002 Reserved for concepts with insufficient information to code with codable children: Secondary | ICD-10-CM

## 2015-01-29 DIAGNOSIS — R229 Localized swelling, mass and lump, unspecified: Principal | ICD-10-CM

## 2015-02-12 ENCOUNTER — Other Ambulatory Visit: Payer: Medicaid Other

## 2015-02-22 ENCOUNTER — Ambulatory Visit
Admission: RE | Admit: 2015-02-22 | Discharge: 2015-02-22 | Disposition: A | Discharge status: Home / Self Care | Payer: Medicaid Other | Source: Ambulatory Visit | Attending: Obstetrics & Gynecology | Admitting: Obstetrics & Gynecology

## 2015-02-22 DIAGNOSIS — R229 Localized swelling, mass and lump, unspecified: Principal | ICD-10-CM

## 2015-02-22 DIAGNOSIS — IMO0002 Reserved for concepts with insufficient information to code with codable children: Secondary | ICD-10-CM

## 2015-06-19 ENCOUNTER — Other Ambulatory Visit: Payer: Self-pay | Admitting: Obstetrics & Gynecology

## 2015-06-19 DIAGNOSIS — N6320 Unspecified lump in the left breast, unspecified quadrant: Secondary | ICD-10-CM

## 2015-06-25 ENCOUNTER — Ambulatory Visit
Admission: RE | Admit: 2015-06-25 | Discharge: 2015-06-25 | Disposition: A | Discharge status: Home / Self Care | Payer: Medicaid Other | Source: Ambulatory Visit | Attending: Obstetrics & Gynecology | Admitting: Obstetrics & Gynecology

## 2015-06-25 DIAGNOSIS — N6320 Unspecified lump in the left breast, unspecified quadrant: Secondary | ICD-10-CM

## 2015-11-28 ENCOUNTER — Other Ambulatory Visit: Payer: Self-pay | Admitting: Obstetrics and Gynecology

## 2015-11-28 DIAGNOSIS — D242 Benign neoplasm of left breast: Secondary | ICD-10-CM

## 2015-12-27 ENCOUNTER — Ambulatory Visit
Admission: RE | Admit: 2015-12-27 | Discharge: 2015-12-27 | Disposition: A | Discharge status: Home / Self Care | Payer: Medicaid Other | Source: Ambulatory Visit | Attending: Obstetrics and Gynecology | Admitting: Obstetrics and Gynecology

## 2015-12-27 DIAGNOSIS — D242 Benign neoplasm of left breast: Secondary | ICD-10-CM

## 2016-07-24 ENCOUNTER — Other Ambulatory Visit: Payer: Self-pay | Admitting: Obstetrics and Gynecology

## 2016-07-24 DIAGNOSIS — N6002 Solitary cyst of left breast: Secondary | ICD-10-CM

## 2016-07-31 ENCOUNTER — Other Ambulatory Visit: Payer: Medicaid Other

## 2016-08-05 ENCOUNTER — Telehealth (HOSPITAL_COMMUNITY): Payer: Self-pay | Admitting: *Deleted

## 2016-08-05 NOTE — Telephone Encounter (Signed)
Telephoned patient at home number and left message to return call to [facility] 

## 2016-09-10 ENCOUNTER — Emergency Department (HOSPITAL_COMMUNITY): Payer: No Typology Code available for payment source

## 2016-09-10 ENCOUNTER — Emergency Department (HOSPITAL_COMMUNITY)
Admission: EM | Admit: 2016-09-10 | Discharge: 2016-09-10 | Disposition: A | Discharge status: Home / Self Care | Payer: No Typology Code available for payment source | Attending: Emergency Medicine | Admitting: Emergency Medicine

## 2016-09-10 ENCOUNTER — Encounter (HOSPITAL_COMMUNITY): Payer: Self-pay

## 2016-09-10 DIAGNOSIS — Y92410 Unspecified street and highway as the place of occurrence of the external cause: Secondary | ICD-10-CM | POA: Insufficient documentation

## 2016-09-10 DIAGNOSIS — R42 Dizziness and giddiness: Secondary | ICD-10-CM | POA: Diagnosis not present

## 2016-09-10 DIAGNOSIS — Z79899 Other long term (current) drug therapy: Secondary | ICD-10-CM | POA: Diagnosis not present

## 2016-09-10 DIAGNOSIS — Y939 Activity, unspecified: Secondary | ICD-10-CM | POA: Diagnosis not present

## 2016-09-10 DIAGNOSIS — I10 Essential (primary) hypertension: Secondary | ICD-10-CM | POA: Diagnosis not present

## 2016-09-10 DIAGNOSIS — E119 Type 2 diabetes mellitus without complications: Secondary | ICD-10-CM | POA: Diagnosis not present

## 2016-09-10 DIAGNOSIS — M545 Low back pain, unspecified: Secondary | ICD-10-CM

## 2016-09-10 DIAGNOSIS — S3992XA Unspecified injury of lower back, initial encounter: Secondary | ICD-10-CM | POA: Diagnosis present

## 2016-09-10 DIAGNOSIS — Y999 Unspecified external cause status: Secondary | ICD-10-CM | POA: Insufficient documentation

## 2016-09-10 MED ORDER — CYCLOBENZAPRINE HCL 5 MG PO TABS: 5.0000 mg | ORAL_TABLET | Freq: Every evening | ORAL | 0 refills | Status: AC | PRN
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
## 2016-09-10 NOTE — ED Notes (Signed)
Pt called no answer 

## 2016-09-17 ENCOUNTER — Encounter (HOSPITAL_COMMUNITY): Payer: Self-pay | Admitting: Emergency Medicine

## 2016-09-17 DIAGNOSIS — F410 Panic disorder [episodic paroxysmal anxiety] without agoraphobia: Secondary | ICD-10-CM | POA: Insufficient documentation

## 2016-09-17 DIAGNOSIS — Z79899 Other long term (current) drug therapy: Secondary | ICD-10-CM | POA: Insufficient documentation

## 2016-09-17 DIAGNOSIS — X509XXA Other and unspecified overexertion or strenuous movements or postures, initial encounter: Secondary | ICD-10-CM | POA: Insufficient documentation

## 2016-09-17 DIAGNOSIS — Y939 Activity, unspecified: Secondary | ICD-10-CM | POA: Insufficient documentation

## 2016-09-17 DIAGNOSIS — E119 Type 2 diabetes mellitus without complications: Secondary | ICD-10-CM | POA: Insufficient documentation

## 2016-09-17 DIAGNOSIS — S93401A Sprain of unspecified ligament of right ankle, initial encounter: Secondary | ICD-10-CM | POA: Insufficient documentation

## 2016-09-17 DIAGNOSIS — Y999 Unspecified external cause status: Secondary | ICD-10-CM | POA: Insufficient documentation

## 2016-09-17 DIAGNOSIS — Y929 Unspecified place or not applicable: Secondary | ICD-10-CM | POA: Insufficient documentation

## 2016-09-17 DIAGNOSIS — I10 Essential (primary) hypertension: Secondary | ICD-10-CM | POA: Insufficient documentation

## 2016-09-17 NOTE — ED Triage Notes (Signed)
Pt states she is feeling strange having panic attacks High blood pressure inability to sleep

## 2016-09-18 ENCOUNTER — Emergency Department (HOSPITAL_COMMUNITY): Payer: Self-pay

## 2016-09-18 ENCOUNTER — Emergency Department (HOSPITAL_COMMUNITY)
Admission: EM | Admit: 2016-09-18 | Discharge: 2016-09-18 | Disposition: A | Discharge status: Home / Self Care | Payer: Self-pay | Attending: Emergency Medicine | Admitting: Emergency Medicine

## 2016-09-18 DIAGNOSIS — F419 Anxiety disorder, unspecified: Secondary | ICD-10-CM

## 2016-09-18 DIAGNOSIS — S93401A Sprain of unspecified ligament of right ankle, initial encounter: Secondary | ICD-10-CM

## 2016-09-18 LAB — CBC WITH DIFFERENTIAL/PLATELET
BASOS ABS: 0 10*3/uL (ref 0.0–0.1)
Basophils Relative: 0 %
Eosinophils Absolute: 0.1 10*3/uL (ref 0.0–0.7)
Eosinophils Relative: 2 %
HEMATOCRIT: 39.9 % (ref 36.0–46.0)
Hemoglobin: 13.6 g/dL (ref 12.0–15.0)
LYMPHS PCT: 47 %
Lymphs Abs: 3.1 10*3/uL (ref 0.7–4.0)
MCH: 28.9 pg (ref 26.0–34.0)
MCHC: 34.1 g/dL (ref 30.0–36.0)
MCV: 84.7 fL (ref 78.0–100.0)
MONO ABS: 0.5 10*3/uL (ref 0.1–1.0)
Monocytes Relative: 7 %
NEUTROS ABS: 2.8 10*3/uL (ref 1.7–7.7)
Neutrophils Relative %: 44 %
Platelets: 243 10*3/uL (ref 150–400)
RBC: 4.71 MIL/uL (ref 3.87–5.11)
RDW: 12.5 % (ref 11.5–15.5)
WBC: 6.5 10*3/uL (ref 4.0–10.5)

## 2016-09-18 LAB — BASIC METABOLIC PANEL
Anion gap: 7 (ref 5–15)
BUN: 11 mg/dL (ref 6–20)
CALCIUM: 9.6 mg/dL (ref 8.9–10.3)
CO2: 29 mmol/L (ref 22–32)
CREATININE: 0.84 mg/dL (ref 0.44–1.00)
Chloride: 104 mmol/L (ref 101–111)
Glucose, Bld: 117 mg/dL — ABNORMAL HIGH (ref 65–99)
Potassium: 4.6 mmol/L (ref 3.5–5.1)
SODIUM: 140 mmol/L (ref 135–145)

## 2016-09-18 LAB — I-STAT TROPONIN, ED: TROPONIN I, POC: 0 ng/mL (ref 0.00–0.08)

## 2016-09-18 MED ORDER — LORAZEPAM 1 MG PO TABS
1.0000 mg | ORAL_TABLET | Freq: Once | ORAL | Status: AC
  Administered 2016-09-18: 1 mg via ORAL
  Filled 2016-09-18: qty 1

## 2016-09-18 NOTE — ED Notes (Signed)
Attempted lab draw x 2 but unsuccessful.RN,Lindsay made aware.

## 2016-09-18 NOTE — ED Notes (Signed)
Attempted x 2 to draw labs with no success.

## 2016-09-18 NOTE — ED Provider Notes (Signed)
[facility] DEPT Provider Note   CSN: [id number] Arrival date & time: 09/17/16  2130     History   Chief Complaint Chief Complaint  Patient presents with  . Panic Attack    HPI Taylor Casey is a 46 y.o. female.  Patient presents to the emergency department with chief complaint of palpitations and elevated blood pressure. She states this feels similar to prior panic attack. She states that she has been feeling anxious for the past week, and has been having the symptoms for the past week. 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 She is concerned that there may have been some type of medication "slipped" into her hamburger. Additionally, patient complains of right ankle pain. She states that she twisted her ankle while bowling recently. She requests an x-ray of this. She denies any chest pain or shortness of breath.   The history is provided by the patient. No language interpreter was used.    Past Medical History:  Diagnosis Date  . Diabetes mellitus   . Hypertension     There are no active problems to display for this patient.   Past Surgical History:  Procedure Laterality Date  . CESAREAN SECTION      OB History    Gravida Para Term Preterm AB Living   6 3 3   3 3    SAB TAB Ectopic Multiple Live Births   3               Home Medications    Prior to Admission medications   Medication Sig Start Date End Date Taking? Authorizing Provider  Boric Acid POWD Place 1 suppository vaginally daily.    [provider]  cyclobenzaprine (FLEXERIL) 5 MG tablet Take 1-2 tablets (5-10 mg total) by mouth at bedtime as needed for muscle spasms. 09/10/16   Lorin Glass, PA-C  Flaxseed, Linseed, (FLAX SEED OIL) 1000 MG CAPS Take 1,000 mg by mouth daily.    [provider]  Garlic 1478 MG CAPS Take by mouth.    [provider]  ibuprofen (ADVIL,MOTRIN) 800 MG tablet  Take one every 8 hours for aches and fever 12/28/14   Milton Ferguson, MD    Family History Family History  Problem Relation Age of Onset  . Diabetes Paternal Grandfather   . Hypertension Mother   . Cancer Sister        breast cancer    Social History Social History  Substance Use Topics  . Smoking status: Never Smoker  . Smokeless tobacco: Never Used  . Alcohol use 1.0 oz/week    2 Standard drinks or equivalent per week     Allergies   Patient has no allergy information on record.   Review of Systems Review of Systems  All other systems reviewed and are negative.    Physical Exam Updated Vital Signs BP (!) 138/92 (BP Location: Left Arm)   Pulse 78   Temp 98 F (36.7 C) (Oral)   Resp 18   LMP 12/28/2014   SpO2 93%   Physical Exam  Constitutional: She is oriented to person, place, and time. She appears well-developed and well-nourished.  HENT:  Head: Normocephalic and atraumatic.  Eyes: Conjunctivae and EOM are normal. Pupils are equal, round, and reactive to light.  Neck: Normal range of motion. Neck supple.  Cardiovascular: Normal rate and regular rhythm.  Exam reveals no gallop and no friction rub.  No murmur heard. Pulmonary/Chest: Effort normal and breath sounds normal. No respiratory distress. She has no wheezes. She has no rales. She exhibits no tenderness.  Abdominal: Soft. Bowel sounds are normal. She exhibits no distension and no mass. There is no tenderness. There is no rebound and no guarding.  Musculoskeletal: Normal range of motion. She exhibits no edema or tenderness.  Neurological: She is alert and oriented to person, place, and time.  Skin: Skin is warm and dry.  Psychiatric: She has a normal mood and affect. Her behavior is normal. Judgment and thought content normal.  Nursing note and vitals reviewed.    ED Treatments / Results  Labs (all labs ordered are listed, but only abnormal results are displayed) Labs Reviewed  BASIC METABOLIC  PANEL - Abnormal; Notable for the following:       Result Value   Glucose, Bld 117 (*)    All other components within normal limits  CBC WITH DIFFERENTIAL/PLATELET  I-STAT TROPOININ, ED    EKG  EKG Interpretation None       Radiology Dg Ankle Complete Right  Result Date: 09/18/2016 CLINICAL DATA:  Persistent pain after twisting injury 3 weeks ago EXAM: RIGHT ANKLE - COMPLETE 3+ VIEW COMPARISON:  None. FINDINGS: There is no evidence of fracture, dislocation, or joint effusion. There is no evidence of arthropathy or other focal bone abnormality. Soft tissues are unremarkable. IMPRESSION: Negative. Electronically Signed   By: Andreas Newport M.D.   On: 09/18/2016 03:04    Procedures Procedures (including critical care time)  Medications Ordered in ED Medications  LORazepam (ATIVAN) tablet 1 mg (not administered)     Initial Impression / Assessment and Plan / ED Course  I have reviewed the triage vital signs and the nursing notes.  Pertinent labs & imaging results that were available during my care of the patient were reviewed by me and considered in my medical decision making (see chart for details).     Patient with symptoms that seem consistent with panic attack or anxiety. She had some palpitations throughout the course the week. She has a history of anxiety, and believes that this is associated. Patient given dose of Ativan, and has had resolution of her symptoms. She feels well now. Plan for outpatient follow-up.  Final Clinical Impressions(s) / ED Diagnoses   Final diagnoses:  Anxiety  Sprain of right ankle, unspecified ligament, initial encounter    New Prescriptions New Prescriptions   No medications on file     Montine Circle, Hershal Coria 09/18/16 [place], MD 09/18/16 [phone]

## 2016-09-25 ENCOUNTER — Emergency Department (HOSPITAL_COMMUNITY)
Admission: EM | Admit: 2016-09-25 | Discharge: 2016-09-25 | Disposition: A | Discharge status: Home / Self Care | Payer: Self-pay | Attending: Emergency Medicine | Admitting: Emergency Medicine

## 2016-09-25 ENCOUNTER — Encounter (HOSPITAL_COMMUNITY): Payer: Self-pay | Admitting: Emergency Medicine

## 2016-09-25 DIAGNOSIS — Z79899 Other long term (current) drug therapy: Secondary | ICD-10-CM | POA: Insufficient documentation

## 2016-09-25 DIAGNOSIS — F410 Panic disorder [episodic paroxysmal anxiety] without agoraphobia: Secondary | ICD-10-CM | POA: Insufficient documentation

## 2016-09-25 DIAGNOSIS — E119 Type 2 diabetes mellitus without complications: Secondary | ICD-10-CM | POA: Insufficient documentation

## 2016-09-25 DIAGNOSIS — I10 Essential (primary) hypertension: Secondary | ICD-10-CM | POA: Insufficient documentation

## 2016-09-25 HISTORY — DX: Anxiety disorder, unspecified: F41.9

## 2016-09-25 LAB — CBC WITH DIFFERENTIAL/PLATELET
BASOS ABS: 0 10*3/uL (ref 0.0–0.1)
Basophils Relative: 0 %
EOS PCT: 0 %
Eosinophils Absolute: 0 10*3/uL (ref 0.0–0.7)
HEMATOCRIT: 39.8 % (ref 36.0–46.0)
Hemoglobin: 13.8 g/dL (ref 12.0–15.0)
LYMPHS PCT: 37 %
Lymphs Abs: 2.5 10*3/uL (ref 0.7–4.0)
MCH: 29.9 pg (ref 26.0–34.0)
MCHC: 34.7 g/dL (ref 30.0–36.0)
MCV: 86.1 fL (ref 78.0–100.0)
MONO ABS: 0.5 10*3/uL (ref 0.1–1.0)
MONOS PCT: 7 %
NEUTROS ABS: 3.7 10*3/uL (ref 1.7–7.7)
Neutrophils Relative %: 56 %
PLATELETS: 240 10*3/uL (ref 150–400)
RBC: 4.62 MIL/uL (ref 3.87–5.11)
RDW: 12.6 % (ref 11.5–15.5)
WBC: 6.7 10*3/uL (ref 4.0–10.5)

## 2016-09-25 LAB — I-STAT BETA HCG BLOOD, ED (MC, WL, AP ONLY): I-stat hCG, quantitative: <5 m[IU]/mL (ref <5)

## 2016-09-25 LAB — BASIC METABOLIC PANEL
ANION GAP: 9 (ref 5–15)
BUN: 11 mg/dL (ref 6–20)
CALCIUM: 9.5 mg/dL (ref 8.9–10.3)
CO2: 24 mmol/L (ref 22–32)
CREATININE: 0.86 mg/dL (ref 0.44–1.00)
Chloride: 104 mmol/L (ref 101–111)
GFR calc Af Amer: >60 mL/min (ref >60)
GLUCOSE: 114 mg/dL — AB (ref 65–99)
Potassium: 4.1 mmol/L (ref 3.5–5.1)
Sodium: 137 mmol/L (ref 135–145)

## 2016-09-25 MED ORDER — LORAZEPAM 1 MG PO TABS: 1.0000 mg | ORAL_TABLET | Freq: Three times a day (TID) | ORAL | 0 refills | Status: AC | PRN

## 2016-09-25 MED ORDER — LORAZEPAM 1 MG PO TABS
1.0000 mg | ORAL_TABLET | Freq: Once | ORAL | Status: AC
  Administered 2016-09-25: 1 mg via ORAL
  Filled 2016-09-25: qty 1

## 2016-09-25 NOTE — Discharge Instructions (Signed)
Take your medication as prescribed as for anxiety/panic attack. Follow-up with your primary care provider within the next week for follow-up evaluation further management. Please return to the Emergency Department if symptoms worsen or new onset of fever, headache, chest pain, difficulty breathing, coughing up blood, abdominal pain, vomiting, numbness, weakness, syncope.
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
## 2016-09-25 NOTE — ED Triage Notes (Signed)
Pt comes from work via EMS with complaints of anxiety and high blood pressure with tachycardia.  Pt was seen here recently for same.  Pt reports receiving medication for it but it is too expensive for her to afford.  BP on arrival from EMS 146/102 and HR 94.  Pt ambulatory and  A&O x4. 20 G Left AC.

## 2016-09-25 NOTE — ED Notes (Signed)
Bed: WTR5 Expected date:  Expected time:  Means of arrival:  Comments: 

## 2016-12-29 ENCOUNTER — Ambulatory Visit
Admission: RE | Admit: 2016-12-29 | Discharge: 2016-12-29 | Disposition: A | Discharge status: Home / Self Care | Payer: Medicaid Other | Source: Ambulatory Visit | Attending: Obstetrics and Gynecology | Admitting: Obstetrics and Gynecology

## 2016-12-29 DIAGNOSIS — N6002 Solitary cyst of left breast: Secondary | ICD-10-CM

## 2017-01-03 ENCOUNTER — Other Ambulatory Visit: Payer: Self-pay

## 2017-01-03 ENCOUNTER — Emergency Department (HOSPITAL_COMMUNITY)
Admission: EM | Admit: 2017-01-03 | Discharge: 2017-01-03 | Disposition: A | Discharge status: Home / Self Care | Payer: Medicaid Other | Attending: Emergency Medicine | Admitting: Emergency Medicine

## 2017-01-03 ENCOUNTER — Emergency Department (HOSPITAL_COMMUNITY): Payer: Medicaid Other

## 2017-01-03 ENCOUNTER — Encounter (HOSPITAL_COMMUNITY): Payer: Self-pay | Admitting: Emergency Medicine

## 2017-01-03 DIAGNOSIS — I10 Essential (primary) hypertension: Secondary | ICD-10-CM | POA: Insufficient documentation

## 2017-01-03 DIAGNOSIS — Z79899 Other long term (current) drug therapy: Secondary | ICD-10-CM | POA: Insufficient documentation

## 2017-01-03 DIAGNOSIS — R072 Precordial pain: Secondary | ICD-10-CM

## 2017-01-03 DIAGNOSIS — R51 Headache: Secondary | ICD-10-CM | POA: Diagnosis present

## 2017-01-03 DIAGNOSIS — N3000 Acute cystitis without hematuria: Secondary | ICD-10-CM | POA: Diagnosis not present

## 2017-01-03 DIAGNOSIS — E119 Type 2 diabetes mellitus without complications: Secondary | ICD-10-CM | POA: Diagnosis not present

## 2017-01-03 LAB — URINALYSIS, ROUTINE W REFLEX MICROSCOPIC
Bilirubin Urine: NEGATIVE
Glucose, UA: NEGATIVE mg/dL
Hgb urine dipstick: NEGATIVE
KETONES UR: NEGATIVE mg/dL
Leukocytes, UA: NEGATIVE
NITRITE: POSITIVE — AB
PH: 5 (ref 5.0–8.0)
PROTEIN: NEGATIVE mg/dL
RBC / HPF: NONE SEEN RBC/hpf (ref 0–5)
Specific Gravity, Urine: 1.019 (ref 1.005–1.030)

## 2017-01-03 LAB — CBC
HCT: 41.9 % (ref 36.0–46.0)
Hemoglobin: 13.8 g/dL (ref 12.0–15.0)
MCH: 29.1 pg (ref 26.0–34.0)
MCHC: 32.9 g/dL (ref 30.0–36.0)
MCV: 88.4 fL (ref 78.0–100.0)
Platelets: 249 10*3/uL (ref 150–400)
RBC: 4.74 MIL/uL (ref 3.87–5.11)
RDW: 13 % (ref 11.5–15.5)
WBC: 5.1 10*3/uL (ref 4.0–10.5)

## 2017-01-03 LAB — POCT I-STAT TROPONIN I
TROPONIN I, POC: 0 ng/mL (ref 0.00–0.08)
Troponin i, poc: 0 ng/mL (ref 0.00–0.08)

## 2017-01-03 LAB — POCT PREGNANCY, URINE: PREG TEST UR: NEGATIVE

## 2017-01-03 LAB — BASIC METABOLIC PANEL
Anion gap: 8 (ref 5–15)
BUN: 10 mg/dL (ref 6–20)
CO2: 25 mmol/L (ref 22–32)
Calcium: 9.1 mg/dL (ref 8.9–10.3)
Chloride: 105 mmol/L (ref 101–111)
Creatinine, Ser: 0.82 mg/dL (ref 0.44–1.00)
GFR calc Af Amer: >60 mL/min (ref >60)
GFR calc non Af Amer: >60 mL/min (ref >60)
Glucose, Bld: 103 mg/dL — ABNORMAL HIGH (ref 65–99)
Potassium: 4 mmol/L (ref 3.5–5.1)
Sodium: 138 mmol/L (ref 135–145)

## 2017-01-03 MED ORDER — CEPHALEXIN 500 MG PO CAPS: 500.0000 mg | ORAL_CAPSULE | Freq: Four times a day (QID) | ORAL | 0 refills | Status: AC

## 2017-01-03 MED ORDER — ACETAMINOPHEN 500 MG PO TABS
1000.0000 mg | ORAL_TABLET | Freq: Once | ORAL | Status: AC
  Administered 2017-01-03: 1000 mg via ORAL
  Filled 2017-01-03: qty 2

## 2017-01-03 MED ORDER — DEXTROSE 5 % IV SOLN
1.0000 g | Freq: Once | INTRAVENOUS | Status: AC
  Administered 2017-01-03: 1 g via INTRAVENOUS
  Filled 2017-01-03: qty 10

## 2017-01-03 MED ORDER — SODIUM CHLORIDE 0.9 % IV SOLN
INTRAVENOUS | Status: DC
  Administered 2017-01-03: 19:00:00 via INTRAVENOUS

## 2017-01-03 NOTE — ED Triage Notes (Signed)
Patient reports pus in urine for abut week, headaches intermittently over couple weeks, panic attacks since June when was in [facility].   Patient adds chest pain on left side and left arm pain.

## 2017-01-03 NOTE — ED Provider Notes (Signed)
[facility] DEPT Provider Note   CSN: [id number] Arrival date & time: 01/03/17  1122     History   Chief Complaint Chief Complaint  Patient presents with  . Dysuria  . Headache  . Anxiety  . Chest Pain    HPI Taylor Casey is a 46 y.o. female.  Patient with a complaint of possible urinary tract infection. Patient noticed some pus in her urine starting last night. She's had a headache the all over for 3 weeks. This is been constant. Also developed chest pain 3 weeks ago which is intermittent but when present there for a long period of time. Left-sided chest radiates to left arm. No shortness of breath no nausea or vomiting no fevers. No low back pain.      Past Medical History:  Diagnosis Date  . Anxiety   . Diabetes mellitus   . Hypertension     There are no active problems to display for this patient.   Past Surgical History:  Procedure Laterality Date  . CESAREAN SECTION      OB History    Gravida Para Term Preterm AB Living   6 3 3   3 3    SAB TAB Ectopic Multiple Live Births   3               Home Medications    Prior to Admission medications   Medication Sig Start Date End Date Taking? Authorizing Provider  Boric Acid POWD Place 1 suppository vaginally every 7 (seven) days. 600 MG (compund)   Yes [provider]  LORazepam (ATIVAN) 1 MG tablet Take 1 tablet (1 mg total) by mouth 3 (three) times daily as needed for anxiety. 09/25/16  Yes Nona Dell, PA-C  Multiple Vitamin (MULTIVITAMIN WITH MINERALS) TABS tablet Take 1 tablet by mouth daily.   Yes [provider]  cephALEXin (KEFLEX) 500 MG capsule Take 1 capsule (500 mg total) by mouth 4 (four) times daily. 01/03/17   Fredia Sorrow, MD  citalopram (CELEXA) 20 MG tablet Take 20 mg by mouth daily.    [provider]  cyclobenzaprine (FLEXERIL) 5 MG tablet Take 1-2 tablets (5-10 mg total) by mouth at bedtime as needed for muscle spasms. Patient not  taking: Reported on 09/18/2016 09/10/16   Lorin Glass, PA-C  ibuprofen (ADVIL,MOTRIN) 800 MG tablet Take one every 8 hours for aches and fever Patient not taking: Reported on 09/18/2016 12/28/14   Milton Ferguson, MD    Family History Family History  Problem Relation Age of Onset  . Diabetes Paternal Grandfather   . Hypertension Mother   . Cancer Sister        breast cancer    Social History Social History  Substance Use Topics  . Smoking status: Never Smoker  . Smokeless tobacco: Never Used  . Alcohol use 1.0 oz/week    2 Standard drinks or equivalent per week     Allergies   Patient has no known allergies.   Review of Systems Review of Systems  Constitutional: Negative for fever.  HENT: Negative for congestion.   Eyes: Negative for redness.  Respiratory: Negative for shortness of breath.   Cardiovascular: Positive for chest pain.  Gastrointestinal: Negative for abdominal pain.  Genitourinary: Positive for dysuria and frequency.  Musculoskeletal: Negative for back pain.  Skin: Negative for rash.  Neurological: Negative for headaches.  Hematological: Does not bruise/bleed easily.  Psychiatric/Behavioral: Negative for confusion.     Physical Exam Updated Vital Signs BP 116/78  Pulse 98   Temp (!) 97.5 F (36.4 C) (Oral)   Resp 16   Ht 1.499 m (4\' 11" )   LMP 12/28/2014   SpO2 99%   Physical Exam  Constitutional: She is oriented to person, place, and time. She appears well-developed and well-nourished. No distress.  HENT:  Head: Normocephalic and atraumatic.  Mouth/Throat: Oropharynx is clear and moist.  Eyes: Pupils are equal, round, and reactive to light. Conjunctivae and EOM are normal.  Neck: Normal range of motion. Neck supple.  Cardiovascular: Normal rate, regular rhythm and normal heart sounds.   Pulmonary/Chest: Effort normal and breath sounds normal. No respiratory distress. She exhibits tenderness.  Abdominal: Soft. Bowel sounds are normal.  There is no tenderness.  Musculoskeletal: Normal range of motion.  Neurological: She is alert and oriented to person, place, and time. No cranial nerve deficit or sensory deficit. She exhibits normal muscle tone. Coordination normal.  Skin: Skin is warm.  Nursing note and vitals reviewed.    ED Treatments / Results  Labs (all labs ordered are listed, but only abnormal results are displayed) Labs Reviewed  BASIC METABOLIC PANEL - Abnormal; Notable for the following:       Result Value   Glucose, Bld 103 (*)    All other components within normal limits  URINALYSIS, ROUTINE W REFLEX MICROSCOPIC - Abnormal; Notable for the following:    APPearance HAZY (*)    Nitrite POSITIVE (*)    Bacteria, UA MANY (*)    Squamous Epithelial / LPF 0-5 (*)    All other components within normal limits  URINE CULTURE  CBC  I-STAT TROPONIN, ED  POC URINE PREG, ED  POCT I-STAT TROPONIN I  POCT PREGNANCY, URINE  I-STAT TROPONIN, ED  POCT I-STAT TROPONIN I    EKG  EKG Interpretation None     ED ECG REPORT   Date: 01/03/2017  Rate: 81  Rhythm: normal sinus rhythm  QRS Axis: normal  Intervals: PR prolonged  ST/T Wave abnormalities: normal  Conduction Disutrbances:none  Narrative Interpretation:   Old EKG Reviewed: none available  I have personally reviewed the EKG tracing and agree with the computerized printout as noted.   Radiology Dg Chest 2 View  Result Date: 01/03/2017 CLINICAL DATA:  Chest pain. EXAM: CHEST  2 VIEW COMPARISON:  Radiographs of December 28, 2014. FINDINGS: The heart size and mediastinal contours are within normal limits. Both lungs are clear. No pneumothorax or pleural effusion is noted. The visualized skeletal structures are unremarkable. IMPRESSION: No active cardiopulmonary disease. Electronically Signed   By: Marijo Conception, M.D.   On: 01/03/2017 12:06   Ct Head Wo Contrast  Result Date: 01/03/2017 CLINICAL DATA:  Chest pain, panic attacks, abnormal urine.  EXAM: CT HEAD WITHOUT CONTRAST TECHNIQUE: Contiguous axial images were obtained from the base of the skull through the vertex without intravenous contrast. COMPARISON:  12/04/2009 FINDINGS: Brain: No evidence of acute infarction, hemorrhage, hydrocephalus, extra-axial collection or mass lesion/mass effect. Vascular: No hyperdense vessel or unexpected calcification. Skull: Normal. Negative for fracture or focal lesion. Sinuses/Orbits: No acute finding. Other: None. IMPRESSION: No acute intracranial abnormality. Electronically Signed   By: Fidela Salisbury M.D.   On: 01/03/2017 17:18    Procedures Procedures (including critical care time)  Medications Ordered in ED Medications  0.9 %  sodium chloride infusion ( Intravenous New Bag/Given 01/03/17 1843)  cefTRIAXone (ROCEPHIN) 1 g in dextrose 5 % 50 mL IVPB (1 g Intravenous New Bag/Given 01/03/17 1843)  Initial Impression / Assessment and Plan / ED Course  I have reviewed the triage vital signs and the nursing notes.  Pertinent labs & imaging results that were available during my care of the patient were reviewed by me and considered in my medical decision making (see chart for details).    Patient workup for the headache was CT head without any acute findings. Workup for the chest pain chest x-ray troponin negative. EKG without any acute changes. Chest pain is tender to palpation and reproducible. May very well be chest wall. But certainly no concern for acute cardiac event. Urinalysis was positive nitrite direct culture sent and patient will be treated with a Keflex received 1 dose of Rocephin here. She'll make appointment follow-up with her doctors.   Final Clinical Impressions(s) / ED Diagnoses   Final diagnoses:  Acute cystitis without hematuria  Precordial pain    New Prescriptions New Prescriptions   CEPHALEXIN (KEFLEX) 500 MG CAPSULE    Take 1 capsule (500 mg total) by mouth 4 (four) times daily.     Fredia Sorrow,  MD 01/03/17 [phone]

## 2017-01-03 NOTE — Discharge Instructions (Signed)
Take antibiotic Keflex as directed over the next 7 days. Make an appointment to follow-up with your doctor. Workup for the chest pain without any acute findings. Return for any new or worse symptoms. CT of the head also without any acute normality but if headache persists will require MRI.

## 2017-01-05 LAB — URINE CULTURE

## 2017-01-06 ENCOUNTER — Telehealth: Payer: Self-pay | Admitting: Emergency Medicine

## 2017-01-06 NOTE — Telephone Encounter (Signed)
Post ED Visit - Positive Culture Follow-up  Culture report reviewed by antimicrobial stewardship pharmacist:  []  Elenor Quinones, Pharm.D. []  Heide Guile, Pharm.D., BCPS AQ-ID []  Parks Neptune, Pharm.D., BCPS []  Alycia Rossetti, Pharm.D., BCPS []  [place], Pharm.D., BCPS, AAHIVP []  Legrand Como, Pharm.D., BCPS, AAHIVP []  Salome Arnt, PharmD, BCPS []  Dimitri Ped, PharmD, BCPS []  Vincenza Hews, PharmD, BCPS  Positive urine culture Treated with cephalexin, organism sensitive to the same and no further patient follow-up is required at this time.  Hazle Nordmann 01/06/2017, 11:44 AM

## 2018-02-12 ENCOUNTER — Other Ambulatory Visit: Payer: Self-pay | Admitting: Obstetrics and Gynecology

## 2018-02-12 DIAGNOSIS — N6320 Unspecified lump in the left breast, unspecified quadrant: Secondary | ICD-10-CM

## 2018-02-22 ENCOUNTER — Ambulatory Visit
Admission: RE | Admit: 2018-02-22 | Discharge: 2018-02-22 | Disposition: A | Discharge status: Home / Self Care | Payer: Medicaid Other | Source: Ambulatory Visit | Attending: Obstetrics and Gynecology | Admitting: Obstetrics and Gynecology

## 2018-02-22 ENCOUNTER — Ambulatory Visit
Admission: RE | Admit: 2018-02-22 | Discharge: 2018-02-22 | Disposition: A | Discharge status: Home / Self Care | Payer: 59 | Source: Ambulatory Visit | Attending: Obstetrics and Gynecology | Admitting: Obstetrics and Gynecology

## 2018-02-22 DIAGNOSIS — N6320 Unspecified lump in the left breast, unspecified quadrant: Secondary | ICD-10-CM

## 2018-10-25 IMAGING — MG 2D DIGITAL DIAGNOSTIC BILATERAL MAMMOGRAM WITH CAD AND ADJUNCT T
8 of 13 series · 8 of 29 positions shown · non-contrast
Comparison: Multiple prior studies including 12/29/2016

CLINICAL DATA: Twenty-two month follow-up for probably benign left
breast finding. Family history of breast cancer diagnosed in
patient's mother and sister.

EXAM:
2D DIGITAL DIAGNOSTIC BILATERAL MAMMOGRAM WITH CAD AND ADJUNCT TOMO
ULTRASOUND LEFT BREAST

[L CC (1 of 2)]
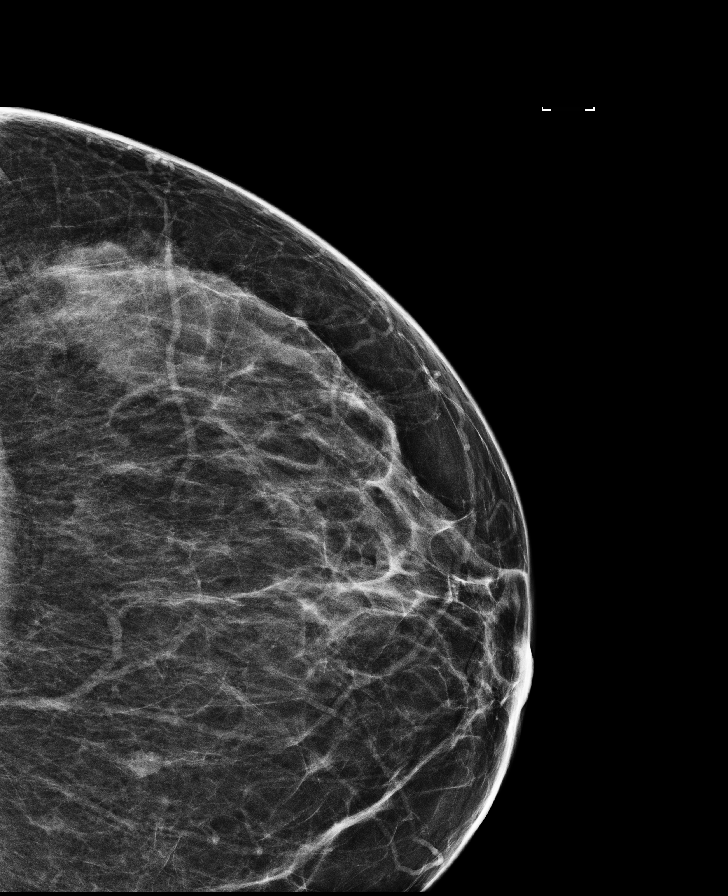

[L MLO]
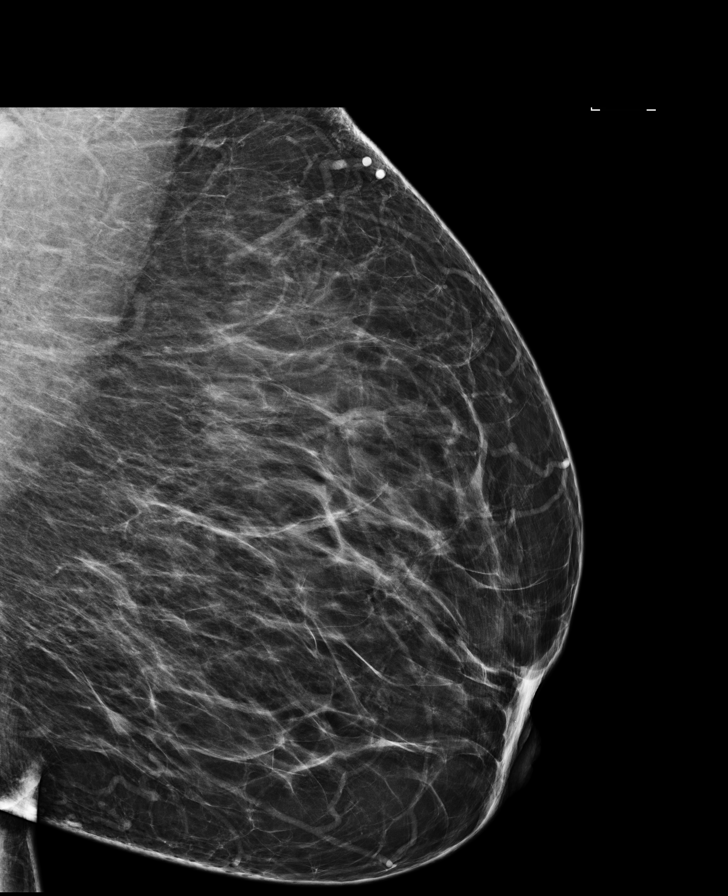

[L CC synth-2D]
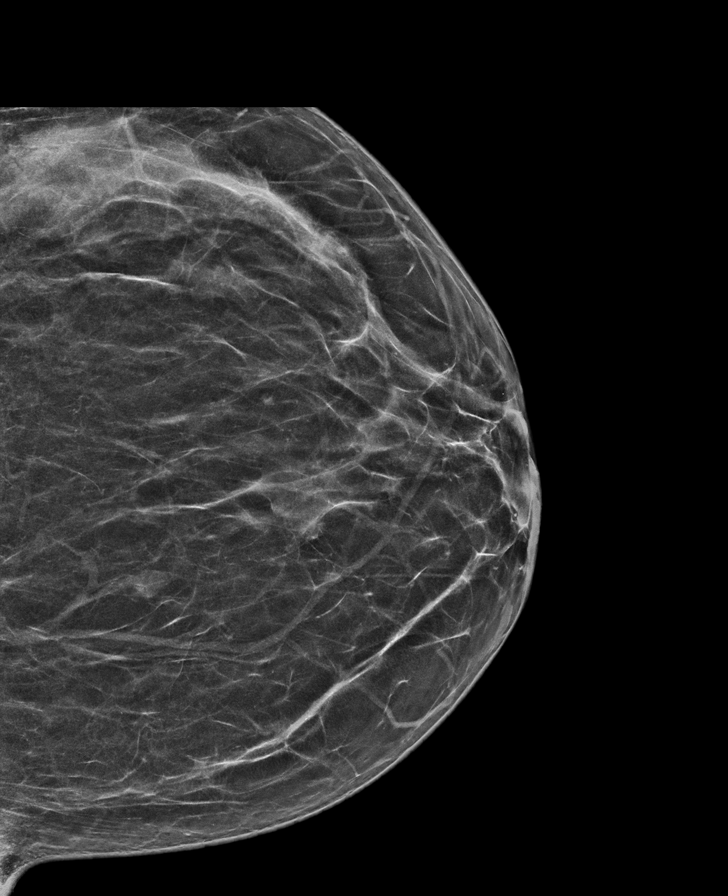

[R MLO]
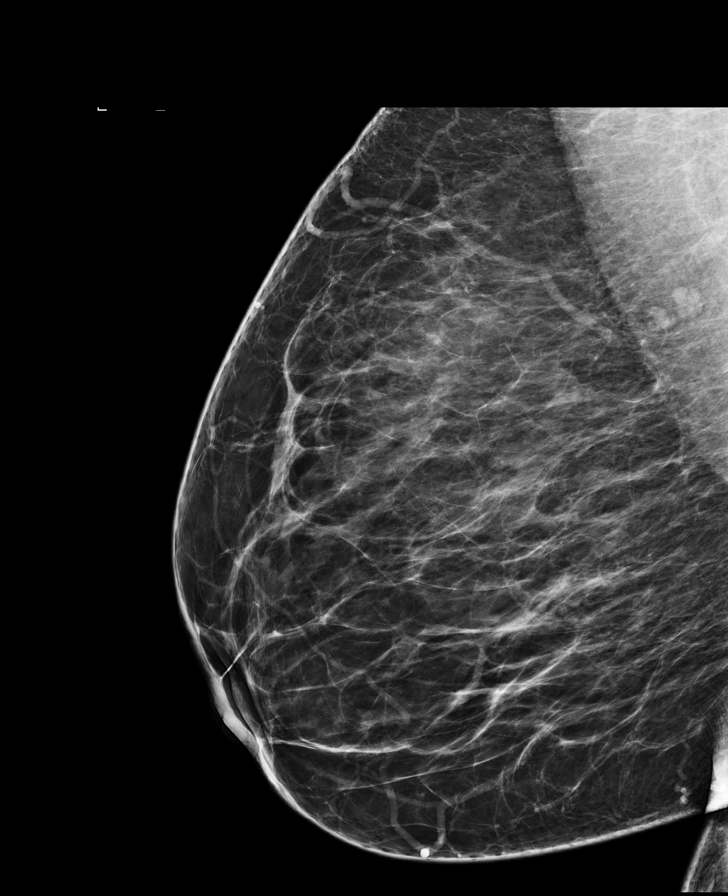

[R CC]
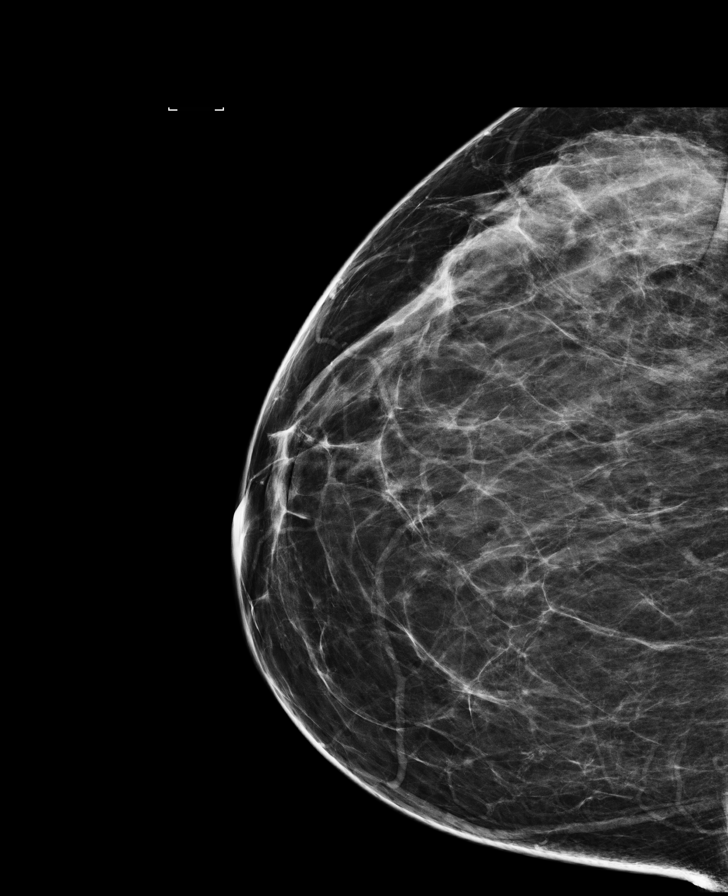

[R MLO synth-2D]
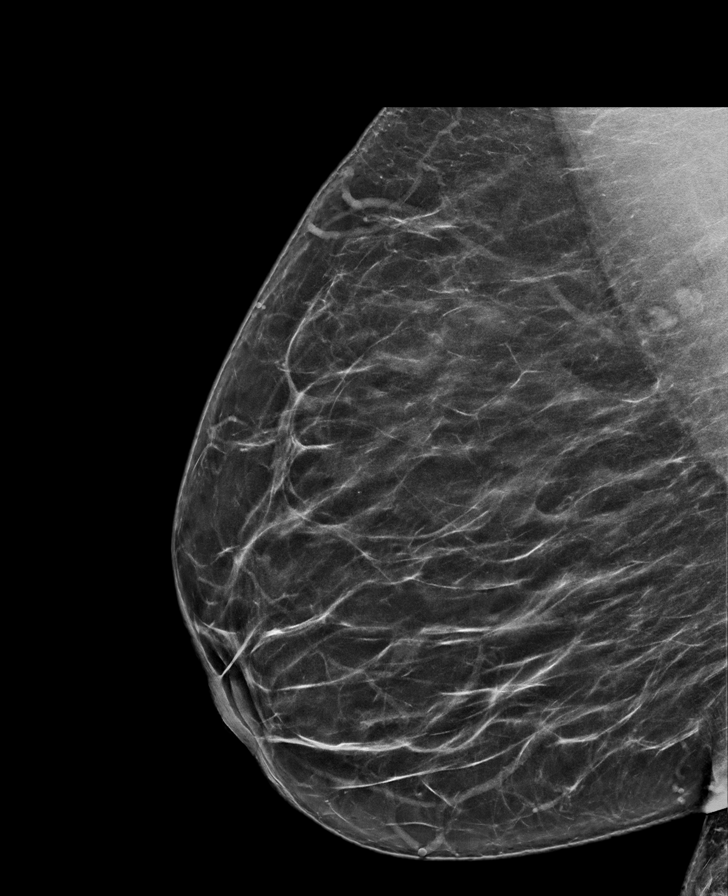

[L CC (2 of 2)]
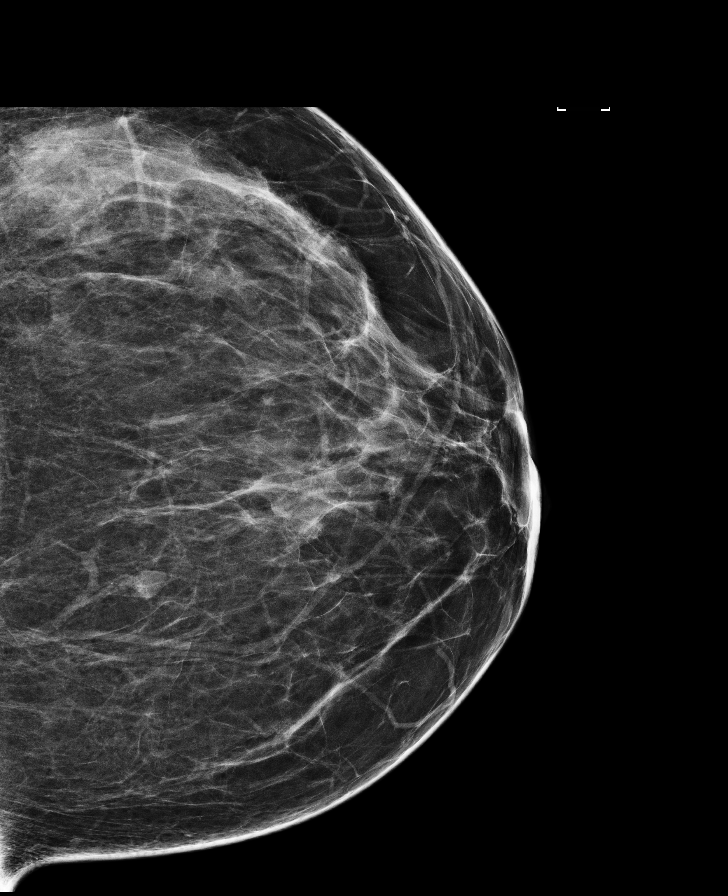

[R CC synth-2D]
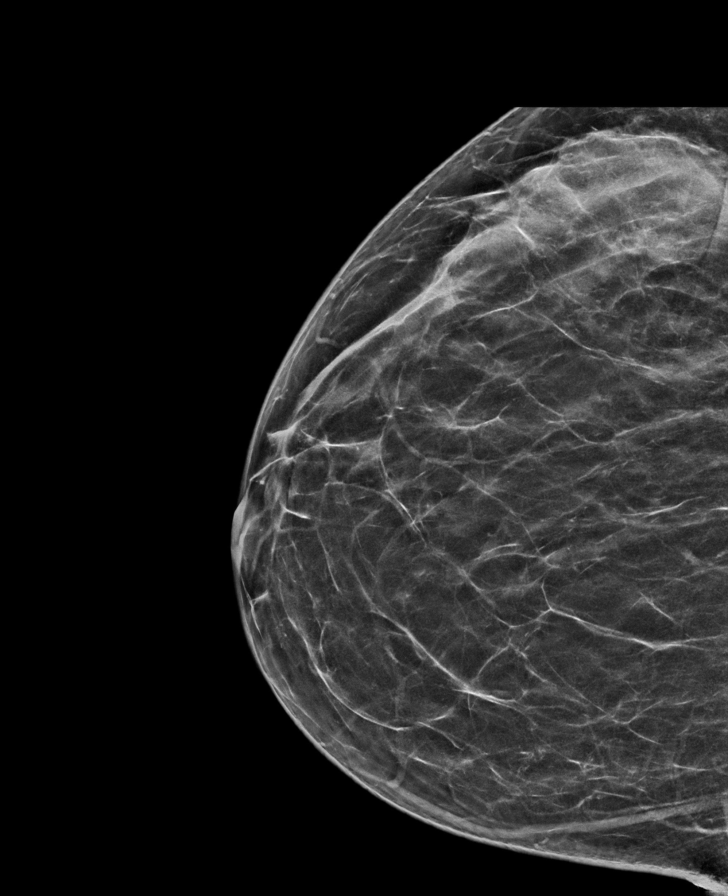

[8 of 29 positions shown; findings below may reference images not displayed]

ACR Breast Density Category b: There are scattered areas of
fibroglandular density.
FINDINGS: Within the lower inner quadrant of the left breast, there is a
circumscribed oval mass, stable in appearance compared to prior
study. No suspicious mass, distortion, or microcalcifications are
identified in either breast.

Mammographic images were processed with CAD.

Targeted ultrasound is performed, showing an oval hypoechoic
circumscribed parallel mass in the 9 o'clock location of the left
breast 5 cm from nipple measuring 1.1 x 0.4 x 0.6 cm. Previously
this measured 1.0 x 0.4 x 0.5 cm. A second nodule is identified in
the 8 o'clock location 1 cm from the nipple with slightly
heterogeneous internal echoes. Mass is parallel and associated with
posterior acoustic enhancement and measures 0.5 x 0.5 x 0.2 cm,
previously 0.5 x 0.4 x 0.2 cm.
IMPRESSION: 1. Stable appearance of probably benign left breast masses.
2.  No mammographic or ultrasound evidence for malignancy.

RECOMMENDATION:
Bilateral diagnostic mammogram and left breast ultrasound are
recommended in 1 year.

I have discussed the findings and recommendations with the patient.
Results were also provided in writing at the conclusion of the
visit. If applicable, a reminder letter will be sent to the patient
regarding the next appointment.

BI-RADS CATEGORY  3: Probably benign.

## 2019-01-17 ENCOUNTER — Other Ambulatory Visit: Payer: Self-pay | Admitting: Obstetrics and Gynecology

## 2019-01-17 DIAGNOSIS — Z1231 Encounter for screening mammogram for malignant neoplasm of breast: Secondary | ICD-10-CM

## 2019-03-08 ENCOUNTER — Other Ambulatory Visit: Payer: Self-pay

## 2019-03-08 ENCOUNTER — Other Ambulatory Visit: Payer: Self-pay | Admitting: Obstetrics and Gynecology

## 2019-03-08 ENCOUNTER — Ambulatory Visit
Admission: RE | Admit: 2019-03-08 | Discharge: 2019-03-08 | Disposition: A | Discharge status: Home / Self Care | Payer: 59 | Source: Ambulatory Visit | Attending: Obstetrics and Gynecology | Admitting: Obstetrics and Gynecology

## 2019-03-08 DIAGNOSIS — N644 Mastodynia: Secondary | ICD-10-CM

## 2019-03-08 DIAGNOSIS — Z1231 Encounter for screening mammogram for malignant neoplasm of breast: Secondary | ICD-10-CM

## 2019-03-31 ENCOUNTER — Ambulatory Visit: Payer: 59

## 2019-03-31 ENCOUNTER — Ambulatory Visit
Admission: RE | Admit: 2019-03-31 | Discharge: 2019-03-31 | Disposition: A | Discharge status: Home / Self Care | Payer: 59 | Source: Ambulatory Visit | Attending: Obstetrics and Gynecology | Admitting: Obstetrics and Gynecology

## 2019-03-31 ENCOUNTER — Other Ambulatory Visit: Payer: Self-pay

## 2019-03-31 DIAGNOSIS — N644 Mastodynia: Secondary | ICD-10-CM

## 2020-06-22 ENCOUNTER — Other Ambulatory Visit: Payer: Self-pay | Admitting: Obstetrics and Gynecology

## 2020-06-22 DIAGNOSIS — Z1231 Encounter for screening mammogram for malignant neoplasm of breast: Secondary | ICD-10-CM
